# Patient Record
Sex: Female | Born: 1995
Health system: Southern US, Community
[De-identification: ages and names within clinical notes are randomized; demographics above are authoritative.]

## PROBLEM LIST (undated history)

## (undated) DIAGNOSIS — E119 Type 2 diabetes mellitus without complications: Secondary | ICD-10-CM

## (undated) HISTORY — DX: Type 2 diabetes mellitus without complications: E11.9

---

## 2013-05-10 LAB — OB RESULTS CONSOLE GC/CHLAMYDIA
Chlamydia: NEGATIVE
Gonorrhea: NEGATIVE

## 2013-05-11 LAB — OB RESULTS CONSOLE HIV ANTIBODY (ROUTINE TESTING): HIV: NONREACTIVE

## 2013-05-11 LAB — OB RESULTS CONSOLE HEPATITIS B SURFACE ANTIGEN: Hepatitis B Surface Ag: NEGATIVE

## 2013-05-11 LAB — OB RESULTS CONSOLE VARICELLA ZOSTER ANTIBODY, IGG: Varicella: IMMUNE

## 2013-05-11 LAB — OB RESULTS CONSOLE ABO/RH: RH Type: POSITIVE

## 2013-05-11 LAB — OB RESULTS CONSOLE RUBELLA ANTIBODY, IGM: Rubella: IMMUNE

## 2013-05-11 LAB — OB RESULTS CONSOLE ANTIBODY SCREEN: Antibody Screen: NEGATIVE

## 2013-05-31 ENCOUNTER — Encounter: Payer: Medicaid Other | Attending: Obstetrics and Gynecology | Admitting: *Deleted

## 2013-05-31 ENCOUNTER — Ambulatory Visit (INDEPENDENT_AMBULATORY_CARE_PROVIDER_SITE_OTHER): Payer: Medicaid Other | Admitting: Obstetrics & Gynecology

## 2013-05-31 ENCOUNTER — Encounter: Payer: Self-pay | Admitting: Obstetrics & Gynecology

## 2013-05-31 VITALS — BP 132/86 | Temp 98.0°F | Ht 67.0 in | Wt 220.1 lb

## 2013-05-31 DIAGNOSIS — Z713 Dietary counseling and surveillance: Secondary | ICD-10-CM | POA: Insufficient documentation

## 2013-05-31 DIAGNOSIS — O99810 Abnormal glucose complicating pregnancy: Secondary | ICD-10-CM | POA: Insufficient documentation

## 2013-05-31 DIAGNOSIS — O2300 Infections of kidney in pregnancy, unspecified trimester: Secondary | ICD-10-CM | POA: Insufficient documentation

## 2013-05-31 DIAGNOSIS — O2390 Unspecified genitourinary tract infection in pregnancy, unspecified trimester: Secondary | ICD-10-CM

## 2013-05-31 DIAGNOSIS — O24919 Unspecified diabetes mellitus in pregnancy, unspecified trimester: Secondary | ICD-10-CM

## 2013-05-31 DIAGNOSIS — O2342 Unspecified infection of urinary tract in pregnancy, second trimester: Secondary | ICD-10-CM

## 2013-05-31 DIAGNOSIS — O24312 Unspecified pre-existing diabetes mellitus in pregnancy, second trimester: Secondary | ICD-10-CM

## 2013-05-31 DIAGNOSIS — O24319 Unspecified pre-existing diabetes mellitus in pregnancy, unspecified trimester: Secondary | ICD-10-CM | POA: Insufficient documentation

## 2013-05-31 LAB — POCT URINALYSIS DIP (DEVICE)
Glucose, UA: 250 mg/dL — AB
Ketones, ur: NEGATIVE mg/dL
Specific Gravity, Urine: 1.025 (ref 1.005–1.030)
pH: 6.5 (ref 5.0–8.0)

## 2013-05-31 MED ORDER — GLUCOSE BLOOD VI STRP: ORAL_STRIP | Status: DC

## 2013-05-31 MED ORDER — CEPHALEXIN 500 MG PO CAPS: 500.0000 mg | ORAL_CAPSULE | Freq: Four times a day (QID) | ORAL | Status: DC

## 2013-05-31 MED ORDER — METFORMIN HCL 1000 MG PO TABS: 1000.0000 mg | ORAL_TABLET | Freq: Two times a day (BID) | ORAL | Status: DC

## 2013-05-31 MED ORDER — ACCU-CHEK FASTCLIX LANCETS MISC: Status: DC

## 2013-05-31 NOTE — Progress Notes (Signed)
ROI signed for records to be obtained from [facility].

## 2013-05-31 NOTE — Patient Instructions (Addendum)
Breastfeeding Deciding to breastfeed is one of the best choices you can make for you and your baby. A change in hormones during pregnancy causes your breast tissue to grow and increases the number and size of your milk ducts. These hormones also allow proteins, sugars, and fats from your blood supply to make breast milk in your milk-producing glands. Hormones prevent breast milk from being released before your baby is born as well as prompt milk flow after birth. Once breastfeeding has begun, thoughts of your baby, as well as his or her sucking or crying, can stimulate the release of milk from your milk-producing glands.  BENEFITS OF BREASTFEEDING For Your Baby  Your first milk (colostrum) helps your baby's digestive system function better.   There are antibodies in your milk that help your baby fight off infections.   Your baby has a lower incidence of asthma, allergies, and sudden infant death syndrome.   The nutrients in breast milk are better for your baby than infant formulas and are designed uniquely for your baby's needs.   Breast milk improves your baby's brain development.   Your baby is less likely to develop other conditions, such as childhood obesity, asthma, or type 2 diabetes mellitus.  For You   Breastfeeding helps to create a very special bond between you and your baby.   Breastfeeding is convenient. Breast milk is always available at the correct temperature and costs nothing.   Breastfeeding helps to burn calories and helps you lose the weight gained during pregnancy.   Breastfeeding makes your uterus contract to its prepregnancy size faster and slows bleeding (lochia) after you give birth.   Breastfeeding helps to lower your risk of developing type 2 diabetes mellitus, osteoporosis, and breast or ovarian cancer later in life. SIGNS THAT YOUR BABY IS HUNGRY Early Signs of Hunger  Increased alertness or activity.  Stretching.  Movement of the head from  side to side.  Movement of the head and opening of the mouth when the corner of the mouth or cheek is stroked (rooting).  Increased sucking sounds, smacking lips, cooing, sighing, or squeaking.  Hand-to-mouth movements.  Increased sucking of fingers or hands. Late Signs of Hunger  Fussing.  Intermittent crying. Extreme Signs of Hunger Signs of extreme hunger will require calming and consoling before your baby will be able to breastfeed successfully. Do not wait for the following signs of extreme hunger to occur before you initiate breastfeeding:   Restlessness.  A loud, strong cry.   Screaming. BREASTFEEDING BASICS Breastfeeding Initiation  Find a comfortable place to sit or lie down, with your neck and back well supported.  Place a pillow or rolled up blanket under your baby to bring him or her to the level of your breast (if you are seated). Nursing pillows are specially designed to help support your arms and your baby while you breastfeed.  Make sure that your baby's abdomen is facing your abdomen.   Gently massage your breast. With your fingertips, massage from your chest wall toward your nipple in a circular motion. This encourages milk flow. You may need to continue this action during the feeding if your milk flows slowly.  Support your breast with 4 fingers underneath and your thumb above your nipple. Make sure your fingers are well away from your nipple and your baby's mouth.   Stroke your baby's lips gently with your finger or nipple.   When your baby's mouth is open wide enough, quickly bring your baby to your   breast, placing your entire nipple and as much of the colored area around your nipple (areola) as possible into your baby's mouth.   More areola should be visible above your baby's upper lip than below the lower lip.   Your baby's tongue should be between his or her lower gum and your breast.   Ensure that your baby's mouth is correctly positioned  around your nipple (latched). Your baby's lips should create a seal on your breast and be turned out (everted).  It is common for your baby to suck about 2 3 minutes in order to start the flow of breast milk. Latching Teaching your baby how to latch on to your breast properly is very important. An improper latch can cause nipple pain and decreased milk supply for you and poor weight gain in your baby. Also, if your baby is not latched onto your nipple properly, he or she may swallow some air during feeding. This can make your baby fussy. Burping your baby when you switch breasts during the feeding can help to get rid of the air. However, teaching your baby to latch on properly is still the best way to prevent fussiness from swallowing air while breastfeeding. Signs that your baby has successfully latched on to your nipple:    Silent tugging or silent sucking, without causing you pain.   Swallowing heard between every 3 4 sucks.    Muscle movement above and in front of his or her ears while sucking.  Signs that your baby has not successfully latched on to nipple:   Sucking sounds or smacking sounds from your baby while breastfeeding.  Nipple pain. If you think your baby has not latched on correctly, slip your finger into the corner of your baby's mouth to break the suction and place it between your baby's gums. Attempt breastfeeding initiation again. Signs of Successful Breastfeeding Signs from your baby:   A gradual decrease in the number of sucks or complete cessation of sucking.   Falling asleep.   Relaxation of his or her body.   Retention of a small amount of milk in his or her mouth.   Letting go of your breast by himself or herself. Signs from you:  Breasts that have increased in firmness, weight, and size 1 3 hours after feeding.   Breasts that are softer immediately after breastfeeding.  Increased milk volume, as well as a change in milk consistency and color by  the 5th day of breastfeeding.   Nipples that are not sore, cracked, or bleeding. Signs That Your Randel Books is Getting Enough Milk  Wetting at least 3 diapers in a 24-hour period. The urine should be clear and pale yellow by age 64411 days.  At least 3 stools in a 24-hour period by age 64411 days. The stool should be soft and yellow.  At least 3 stools in a 24-hour period by age 644 days. The stool should be seedy and yellow.  No loss of weight greater than 10% of birth weight during the first 22 days of age.  Average weight gain of 4 7 ounces (120 210 mL) per week after age 64 days.  Consistent daily weight gain by age 60 days, without weight loss after the age of 2 weeks. After a feeding, your baby may spit up a small amount. This is common. BREASTFEEDING FREQUENCY AND DURATION Frequent feeding will help you make more milk and can prevent sore nipples and breast engorgement. Breastfeed when you feel the need to reduce  the fullness of your breasts or when your baby shows signs of hunger. This is called "breastfeeding on demand." Avoid introducing a pacifier to your baby while you are working to establish breastfeeding (the first 4 6 weeks after your baby is born). After this time you may choose to use a pacifier. Research has shown that pacifier use during the first year of a baby's life decreases the risk of sudden infant death syndrome (SIDS). Allow your baby to feed on each breast as long as he or she wants. Breastfeed until your baby is finished feeding. When your baby unlatches or falls asleep while feeding from the first breast, offer the second breast. Because newborns are often sleepy in the first few weeks of life, you may need to awaken your baby to get him or her to feed. Breastfeeding times will vary from baby to baby. However, the following rules can serve as a guide to help you ensure that your baby is properly fed:  Newborns (babies 4 weeks of age or younger) may breastfeed every 1 3  hours.  Newborns should not go longer than 3 hours during the day or 5 hours during the night without breastfeeding.  You should breastfeed your baby a minimum of 8 times in a 24-hour period until you begin to introduce solid foods to your baby at around 6 months of age. BREAST MILK PUMPING Pumping and storing breast milk allows you to ensure that your baby is exclusively fed your breast milk, even at times when you are unable to breastfeed. This is especially important if you are going back to work while you are still breastfeeding or when you are not able to be present during feedings. Your lactation consultant can give you guidelines on how long it is safe to store breast milk.  A breast pump is a machine that allows you to pump milk from your breast into a sterile bottle. The pumped breast milk can then be stored in a refrigerator or freezer. Some breast pumps are operated by hand, while others use electricity. Ask your lactation consultant which type will work best for you. Breast pumps can be purchased, but some hospitals and breastfeeding support groups lease breast pumps on a monthly basis. A lactation consultant can teach you how to hand express breast milk, if you prefer not to use a pump.  CARING FOR YOUR BREASTS WHILE YOU BREASTFEED Nipples can become dry, cracked, and sore while breastfeeding. The following recommendations can help keep your breasts moisturized and healthy:  Avoid using soap on your nipples.   Wear a supportive bra. Although not required, special nursing bras and tank tops are designed to allow access to your breasts for breastfeeding without taking off your entire bra or top. Avoid wearing underwire style bras or extremely tight bras.  Air dry your nipples for 3 4minutes after each feeding.   Use only cotton bra pads to absorb leaked breast milk. Leaking of breast milk between feedings is normal.   Use lanolin on your nipples after breastfeeding. Lanolin helps to  maintain your skin's normal moisture barrier. If you use pure lanolin you do not need to wash it off before feeding your baby again. Pure lanolin is not toxic to your baby. You may also hand express a few drops of breast milk and gently massage that milk into your nipples and allow the milk to air dry. In the first few weeks after giving birth, some women experience extremely full breasts (engorgement). Engorgement can make   your breasts feel heavy, warm, and tender to the touch. Engorgement peaks within 3 5 days after you give birth. The following recommendations can help ease engorgement:  Completely empty your breasts while breastfeeding or pumping. You may want to start by applying warm, moist heat (in the shower or with warm water-soaked hand towels) just before feeding or pumping. This increases circulation and helps the milk flow. If your baby does not completely empty your breasts while breastfeeding, pump any extra milk after he or she is finished.  Wear a snug bra (nursing or regular) or tank top for 1 2 days to signal your body to slightly decrease milk production.  Apply ice packs to your breasts, unless this is too uncomfortable for you.  Make sure that your baby is latched on and positioned properly while breastfeeding. If engorgement persists after 48 hours of following these recommendations, contact your health care provider or a Advertising copywriter. OVERALL HEALTH CARE RECOMMENDATIONS WHILE BREASTFEEDING  Eat healthy foods. Alternate between meals and snacks, eating 3 of each per day. Because what you eat affects your breast milk, some of the foods may make your baby more irritable than usual. Avoid eating these foods if you are sure that they are negatively affecting your baby.  Drink milk, fruit juice, and water to satisfy your thirst (about 10 glasses a day).   Rest often, relax, and continue to take your prenatal vitamins to prevent fatigue, stress, and anemia.  Continue  breast self-awareness checks.  Avoid chewing and smoking tobacco.  Avoid alcohol and drug use. Some medicines that may be harmful to your baby can pass through breast milk. It is important to ask your health care provider before taking any medicine, including all over-the-counter and prescription medicine as well as vitamin and herbal supplements. It is possible to become pregnant while breastfeeding. If birth control is desired, ask your health care provider about options that will be safe for your baby. SEEK MEDICAL CARE IF:   You feel like you want to stop breastfeeding or have become frustrated with breastfeeding.  You have painful breasts or nipples.  Your nipples are cracked or bleeding.  Your breasts are red, tender, or warm.  You have a swollen area on either breast.  You have a fever or chills.  You have nausea or vomiting.  You have drainage other than breast milk from your nipples.  Your breasts do not become full before feedings by the 5th day after you give birth.  You feel sad and depressed.  Your baby is too sleepy to eat well.  Your baby is having trouble sleeping.   Your baby is wetting less than 3 diapers in a 24-hour period.  Your baby has less than 3 stools in a 24-hour period.  Your baby's skin or the white part of his or her eyes becomes yellow.   Your baby is not gaining weight by 63 days of age. SEEK IMMEDIATE MEDICAL CARE IF:   Your baby is overly tired (lethargic) and does not want to wake up and feed.  Your baby develops an unexplained fever. Document Released: 05/27/2005 Document Revised: 01/27/2013 Document Reviewed: 11/18/2012 [facility] Patient Information 2014 [place], [place].   Type 1 or Type 2 Diabetes Mellitus During Pregnancy Diabetes mellitus, often simply referred to as diabetes, is a long-term (chronic) disease. Type 1 diabetes occurs when the islet cells in the pancreas that make insulin (a hormone) are destroyed and can no  longer make insulin. Type 2 diabetes occurs  when the pancreas does not make enough insulin, the cells are less responsive to the insulin that is made (insulin resistance), or both. Insulin is needed to move sugars from food into the tissue cells. The tissue cells use the sugars for energy. The lack of insulin or the lack of normal response to insulin causes excess sugars to build up in the blood instead of going into the tissue cells. As a result, high blood sugar (hyperglycemia) develops.  If blood glucose levels are kept in the normal range both before and during pregnancy, women can have a healthy pregnancy. If your blood glucose levels are not well controlled, there may be risks to you, your unborn baby (fetus), your labor and delivery, or your newborn baby.  RISK FACTORS  You are predisposed to developing type 1 diabetes if someone in your family has diabetes and you are exposed to certain environmental triggers.  You have an increased chance of developing type 2 diabetes if you have a family history of diabetes and also have one or more of the following risk factors:  Being overweight.  Having an inactive lifestyle.  Having a history of consistently eating high-calorie foods. SYMPTOMS The symptoms of diabetes include:  Increased thirst (polydipsia).  Increased urination (polyuria).  Increased urination during the night (nocturia).  Weight loss. This weight loss may be rapid.  Frequent, recurring infections.  Tiredness (fatigue).  Weakness.  Vision changes, such as blurred vision.  Fruity smell to your breath.  Abdominal pain.  Nausea or vomiting. DIAGNOSIS  Diabetes is diagnosed when blood glucose levels are increased. Your blood glucose level may be checked by one or more of the following blood tests:  A fasting blood glucose test. You will not be allowed to eat for at least 8 hours before a blood sample is taken.  A random blood glucose test. Your blood glucose is  checked at any time of the day regardless of when you ate.  A hemoglobin A1c blood glucose test. A hemoglobin A1c test provides information about blood glucose control over the previous 3 months.  An oral glucose tolerance test (OGTT). Your blood glucose is measured after you have not eaten (fasted) for 1 3 hours and then after you drink a glucose-containing beverage. An OGTT is usually performed during weeks 24 28 of your pregnancy. TREATMENT   You will need to take diabetes medicine or insulin daily to keep blood glucose levels in the desired range.  You will need to match insulin dosing with exercise and healthy food choices. The treatment goal is to maintain the before-meal (preprandial), bedtime, and overnight blood glucose level at 60 99 mg/dL during pregnancy. The treatment goal is to further maintain the peak after-meal blood sugar (postprandial glucose) level at 100 140 mg/dL.  HOME CARE INSTRUCTIONS   Have your hemoglobin A1c level checked twice a year.  Perform daily blood glucose monitoring as directed by your caregiver. It is common to perform frequent blood glucose monitoring.  Monitor urine ketones when you are ill and as directed by your caregiver.  Take your diabetes medicine and insulin as directed by your caregiver to maintain your blood glucose level in the desired range.  Never run out of diabetes medicine or insulin. It is needed every day.  Adjust insulin based on your intake of carbohydrates. Carbohydrates can raise blood glucose levels but need to be included in your diet. Carbohydrates provide vitamins, minerals, and fiber, which are an essential part of a healthy diet. Carbohydrates are  found in fruits, vegetables, whole grains, dairy products, legumes, and foods containing added sugars.  Eat healthy foods. Alternate 3 meals with 3 snacks.  Maintain a healthy weight gain. The usual total expected weight gain varies according to your prepregnancy body mass  index (BMI).  Carry a medical alert card or wear medical alert jewelry.  Carry a 15 gram carbohydrate snack with you at all times to treat low blood sugar (hypoglycemia). Some examples of 15 gram carbohydrate snacks include:  Glucose tablets, 3 or 4.  Glucose gel, 15 gram tube.  Raisins, 2 tablespoons (24 grams).  Jelly beans, 6.  Animal crackers, 8.  Fruit juice, regular soda, or low fat milk, 4 ounces (120 mL).  Gummy treats, 9.  Recognize hypoglycemia. Hypoglycemia during pregnancy occurs with blood glucose levels of 60 mg/dL and below. The risk for hypoglycemia increases when fasting or skipping meals, during or after intense exercise, and during sleep. Hypoglycemia symptoms can include:  Tremors or shakes.  Decreased ability to concentrate.  Sweating.  Increased heart rate.  Headache.  Dry mouth.  Hunger.  Irritability.  Anxiety.  Restless sleep.  Altered speech or coordination.  Confusion.  Treat hypoglycemia promptly. If you are alert and able to safely swallow, follow the 15:15 rule:  Take 15 20 grams of rapid-acting glucose or carbohydrate. Rapid-acting options include glucose gel, glucose tablets, or 4 ounces (120 mL) of fruit juice, regular soda, or low-fat milk.  Check your blood glucose level 15 minutes after taking the glucose.  Take 15 20 grams more of glucose if the repeat blood glucose level is still 70 mg/dL or below.  Eat a meal or snack within 1 hour once blood glucose levels return to normal.  Engage in at least 30 minutes of physical activity a day or as directed by your caregiver. Ten minutes of physical activity timed 30 minutes after each meal is encouraged to control postprandial blood glucose levels.   Be alert to polyuria and polydipsia, which are early signs of hyperglycemia. An early awareness of hyperglycemia allows for prompt treatment. Treat hyperglycemia as directed by your caregiver.  Adjust your insulin dosing and food  intake as needed if you start a new exercise or sport.  Follow your sick day plan at any time you are unable to eat or drink as usual.  Avoid tobacco and alcohol use.  Follow up with your caregiver regularly.  Follow the advice of your caregiver regarding your prenatal and post-delivery (postpartum) appointments, meal planning, exercise, medicines, vitamins, blood tests, other medical tests, and physical activities.  Continue daily skin and foot care. Examine your skin and feet daily for cuts, bruises, redness, nail problems, bleeding, blisters, or sores. A foot exam by a caregiver should be done annually.  Brush your teeth and gums at least twice a day and floss at least once a day. Follow up with your dentist regularly.  Schedule an eye exam during the first trimester of your pregnancy or as directed by your caregiver.  Share your diabetes management plan with your workplace or school.  Stay up-to-date with immunizations.  Learn to manage stress.  Obtain ongoing diabetes education and support as needed. SEEK MEDICAL CARE IF:   You are unable to eat food or drink fluids for more than 6 hours.  You have nausea and vomiting for more than 6 hours.  You have a blood glucose level of 200 mg/dL and you have ketones in your urine.  There is a change in mental status.  You develop vision problems.  You have a persistent headache.  You have upper abdominal pain or discomfort.  You develop an additional serious illness.  You have diarrhea for more than 6 hours.  You have been sick or have had a fever for a couple of days and are not getting better. SEEK IMMEDIATE MEDICAL CARE IF:  You have difficulty breathing.  You no longer feel the baby moving.  You are bleeding or have discharge from your vagina.  You start having premature contractions or labor. MAKE SURE YOU:  Understand these instructions.  Will watch your condition.  Will get help right away if you are not  doing well or get worse. Document Released: 02/19/2012 Document Revised: 05/13/2012 Document Reviewed: 02/19/2012 [facility] Patient Information 2014 [place], [place].

## 2013-05-31 NOTE — Progress Notes (Signed)
Nutrition note: 1st visit consult & DM diet education. Pt has h/o obesity and Type 2 DM. Pt has gained 7.1# @ [redacted]w[redacted]d, which is wnl. Pt reports eating 3 meals & 1 snack/d (normally fruit). Pt stated she received DM education when she was first diagnosed ~4y ago and followed the diet for ~23yr but has not since then. Pt reports no N/V or heartburn. Pt is taking a PNV. Pt reports no walking or physical activity. Pt received verbal & written education on DM diet during pregnancy. Discussed importance/ benefits of BF. Encouraged physical activity. Encouraged protein with all meals & snacks. Discussed wt gain goals of 11-20# or 0.5#/wk. Pt agrees to follow GDM diet with 3 meals & 1-3 snacks/d with proper CHO/ protein combination.  Pt has WIC and is unsure about BF. F/u in 2-4 wks Blondell Reveal, MS, RD, LDN, [facility]

## 2013-05-31 NOTE — Progress Notes (Signed)
Pulse- 84 New ob packet given Weight gain of 11-20lb Flu vaccine given @ [facility]

## 2013-05-31 NOTE — Progress Notes (Signed)
DIABETES: Patient presents with her mother. T2DM diagnosed at age 17. Education one visit with nutritionist at time of diagnosis. Patient reports that she is presently testing FBS and 2hpp all meals yet no readings are available and she did not test this AM. Notes that readings range 120-130 at all times.  Advised to test FBS and 2hpp all meals. Continue with medication as directed. Goal to increase walking for 5X weekly. At present she is not working and inactive at home. Accu Chek Nano BG Monitoring Kit dispensed Lot: [id number] Ex;p: 05/09/14 Will review data next week.

## 2013-05-31 NOTE — Progress Notes (Signed)
Transfer from [facility] for Class B DM diagnosed 4 years ago.  Was under the care of a physician in [place], just moved to the area, no PCP here. On Metformin 500 mg po bid, reports fastings in 130s, postprandials in 140s;does not check frequently Discussed increased risks of maternal and fetal morbidity and mortality with DM, especially poorly controlled DM Also reviewed need for increased surveillance, antenatal testing, labs and delivery by 39 weeks or earlier if indcated Initial labs at [facility] have not been sent, will review and will check any outstanding labs next week Patient was admitted to [facility] for pyelonephritis, still reports urinary symptoms.  UA today showed + nitrites and moderate LE, culture sent.  Keflex presumptively prescribed, will follow up C&S.  Will likely need to be on suppression after this treatment. ROI form to be sent to [facility] for admission details, also need reports from two ultrasounds that were done there including one done a couple of weeks ago (anatomy scan?) Patient to meet with CDE today for DM education.  In the meantime, she was told to increase Metformin to 1000 mg po bid, will titrate regimen based on blood sugars No other complaints or concerns.  Routine obstetric precautions reviewed.

## 2013-06-01 ENCOUNTER — Encounter: Payer: Self-pay | Admitting: *Deleted

## 2013-06-01 LAB — CULTURE, OB URINE: Colony Count: >=100000

## 2013-06-02 ENCOUNTER — Telehealth: Payer: Self-pay

## 2013-06-02 NOTE — Telephone Encounter (Signed)
Message copied by Louanna Raw on Wed Jun 02, 2013  8:03 AM ------      Message from: Jaynie Collins A      Created: Wed Jun 02, 2013 12:41 AM       Already treated with Keflex.  Please call and tell patient results. When she finishes taking her treatment course as prescribed, she needs to take Keflex 500 mg po qhs for the remainder of pregnancy. ------

## 2013-06-02 NOTE — Telephone Encounter (Signed)
Called pt. And informed her of the need to continue taking Keflex throughout her pregnancy. Explained prescription is at her pharmacy and she will take it 4 times a a day every day and that there are multiple refills at the pharmacy. Pt. Verbalized understanding and gratitude and had no other questions or concerns.

## 2013-06-07 ENCOUNTER — Ambulatory Visit (INDEPENDENT_AMBULATORY_CARE_PROVIDER_SITE_OTHER): Payer: Medicaid Other | Admitting: Obstetrics and Gynecology

## 2013-06-07 ENCOUNTER — Encounter: Payer: Self-pay | Admitting: Obstetrics and Gynecology

## 2013-06-07 VITALS — BP 122/75 | Temp 97.3°F | Wt 222.8 lb

## 2013-06-07 DIAGNOSIS — O2390 Unspecified genitourinary tract infection in pregnancy, unspecified trimester: Secondary | ICD-10-CM

## 2013-06-07 DIAGNOSIS — O24919 Unspecified diabetes mellitus in pregnancy, unspecified trimester: Secondary | ICD-10-CM

## 2013-06-07 DIAGNOSIS — O2300 Infections of kidney in pregnancy, unspecified trimester: Secondary | ICD-10-CM

## 2013-06-07 DIAGNOSIS — O24312 Unspecified pre-existing diabetes mellitus in pregnancy, second trimester: Secondary | ICD-10-CM

## 2013-06-07 DIAGNOSIS — N12 Tubulo-interstitial nephritis, not specified as acute or chronic: Secondary | ICD-10-CM

## 2013-06-07 LAB — POCT URINALYSIS DIP (DEVICE)
Ketones, ur: NEGATIVE mg/dL
Nitrite: NEGATIVE
Protein, ur: NEGATIVE mg/dL
Specific Gravity, Urine: 1.02 (ref 1.005–1.030)
Urobilinogen, UA: 0.2 mg/dL (ref 0.0–1.0)
pH: 7 (ref 5.0–8.0)

## 2013-06-07 MED ORDER — GLYBURIDE 2.5 MG PO TABS: ORAL_TABLET | ORAL | Status: DC

## 2013-06-07 NOTE — Progress Notes (Signed)
U/S scheduled 06/15/13 at 930 am.

## 2013-06-07 NOTE — Progress Notes (Signed)
Patient doing well without complaints. FM/PTL precautions reviewed. CBGs reviewed and all fasting in the 110's range. 2hr pp majority within range highest value of 140 on Christmas day Will start glyburide 2.5 mg qHS Will order baseline labs

## 2013-06-07 NOTE — Progress Notes (Signed)
P-85 

## 2013-06-14 ENCOUNTER — Encounter: Payer: Self-pay | Admitting: *Deleted

## 2013-06-15 ENCOUNTER — Ambulatory Visit (HOSPITAL_COMMUNITY)
Admission: RE | Admit: 2013-06-15 | Discharge: 2013-06-15 | Disposition: A | Discharge status: Home / Self Care | Payer: Medicaid Other | Source: Ambulatory Visit | Attending: Obstetrics and Gynecology | Admitting: Obstetrics and Gynecology

## 2013-06-15 ENCOUNTER — Encounter: Payer: Self-pay | Admitting: Obstetrics and Gynecology

## 2013-06-15 DIAGNOSIS — O24919 Unspecified diabetes mellitus in pregnancy, unspecified trimester: Secondary | ICD-10-CM | POA: Insufficient documentation

## 2013-06-15 DIAGNOSIS — O24312 Unspecified pre-existing diabetes mellitus in pregnancy, second trimester: Secondary | ICD-10-CM

## 2013-06-21 ENCOUNTER — Encounter: Payer: Self-pay | Admitting: Obstetrics and Gynecology

## 2013-06-21 ENCOUNTER — Ambulatory Visit (INDEPENDENT_AMBULATORY_CARE_PROVIDER_SITE_OTHER): Payer: Medicaid Other | Admitting: Obstetrics and Gynecology

## 2013-06-21 VITALS — BP 114/76 | Temp 98.5°F | Wt 223.8 lb

## 2013-06-21 DIAGNOSIS — N12 Tubulo-interstitial nephritis, not specified as acute or chronic: Secondary | ICD-10-CM

## 2013-06-21 DIAGNOSIS — O2390 Unspecified genitourinary tract infection in pregnancy, unspecified trimester: Secondary | ICD-10-CM

## 2013-06-21 DIAGNOSIS — O2300 Infections of kidney in pregnancy, unspecified trimester: Secondary | ICD-10-CM

## 2013-06-21 DIAGNOSIS — O24312 Unspecified pre-existing diabetes mellitus in pregnancy, second trimester: Secondary | ICD-10-CM

## 2013-06-21 DIAGNOSIS — O24919 Unspecified diabetes mellitus in pregnancy, unspecified trimester: Secondary | ICD-10-CM

## 2013-06-21 LAB — POCT URINALYSIS DIP (DEVICE)
Bilirubin Urine: NEGATIVE
GLUCOSE, UA: NEGATIVE mg/dL
Hgb urine dipstick: NEGATIVE
Ketones, ur: 15 mg/dL — AB
Nitrite: NEGATIVE
PROTEIN: NEGATIVE mg/dL
Specific Gravity, Urine: 1.015 (ref 1.005–1.030)
UROBILINOGEN UA: 0.2 mg/dL (ref 0.0–1.0)
pH: 6.5 (ref 5.0–8.0)

## 2013-06-21 NOTE — Progress Notes (Signed)
Pulse: 82

## 2013-06-21 NOTE — Progress Notes (Signed)
Patient doing well without complaints. CBG f- all but one within range 105  2 hr pp all but 2 within range 125 and 127 (both after dinner). Will schedule f/u antomy ultrasound and fetal echo. Patient is having some transportation issues as she lives in [place].

## 2013-06-21 NOTE — Progress Notes (Signed)
U/S scheduled 07/22/13 at 1015 am. Fetal Echo scheduled 07/13/13 at 1 pm with Dr. Elizebeth Brookingotton. Case # [id number]. Auth # [id number]. Patient has contact information for both appointments.

## 2013-07-05 ENCOUNTER — Ambulatory Visit (INDEPENDENT_AMBULATORY_CARE_PROVIDER_SITE_OTHER): Payer: Medicaid Other | Admitting: Family Medicine

## 2013-07-05 VITALS — BP 125/79 | Wt 226.5 lb

## 2013-07-05 DIAGNOSIS — E119 Type 2 diabetes mellitus without complications: Secondary | ICD-10-CM

## 2013-07-05 DIAGNOSIS — N12 Tubulo-interstitial nephritis, not specified as acute or chronic: Secondary | ICD-10-CM

## 2013-07-05 DIAGNOSIS — O24319 Unspecified pre-existing diabetes mellitus in pregnancy, unspecified trimester: Secondary | ICD-10-CM

## 2013-07-05 DIAGNOSIS — O2300 Infections of kidney in pregnancy, unspecified trimester: Secondary | ICD-10-CM

## 2013-07-05 DIAGNOSIS — Z23 Encounter for immunization: Secondary | ICD-10-CM

## 2013-07-05 DIAGNOSIS — O2390 Unspecified genitourinary tract infection in pregnancy, unspecified trimester: Secondary | ICD-10-CM

## 2013-07-05 DIAGNOSIS — O24919 Unspecified diabetes mellitus in pregnancy, unspecified trimester: Secondary | ICD-10-CM

## 2013-07-05 LAB — CBC
HCT: 32.2 % — ABNORMAL LOW (ref 36.0–46.0)
Hemoglobin: 11.2 g/dL — ABNORMAL LOW (ref 12.0–15.0)
MCH: 31.1 pg (ref 26.0–34.0)
MCHC: 34.8 g/dL (ref 30.0–36.0)
MCV: 89.4 fL (ref 78.0–100.0)
PLATELETS: 230 10*3/uL (ref 150–400)
RBC: 3.6 MIL/uL — AB (ref 3.87–5.11)
RDW: 13.5 % (ref 11.5–15.5)
WBC: 4.6 10*3/uL (ref 4.0–10.5)

## 2013-07-05 LAB — POCT URINALYSIS DIP (DEVICE)
BILIRUBIN URINE: NEGATIVE
GLUCOSE, UA: 250 mg/dL — AB
Hgb urine dipstick: NEGATIVE
Ketones, ur: NEGATIVE mg/dL
Nitrite: NEGATIVE
Protein, ur: NEGATIVE mg/dL
Specific Gravity, Urine: 1.025 (ref 1.005–1.030)
UROBILINOGEN UA: 1 mg/dL (ref 0.0–1.0)
pH: 6.5 (ref 5.0–8.0)

## 2013-07-05 LAB — RPR

## 2013-07-05 LAB — HIV ANTIBODY (ROUTINE TESTING W REFLEX): HIV: NONREACTIVE

## 2013-07-05 MED ORDER — TETANUS-DIPHTH-ACELL PERTUSSIS 5-2.5-18.5 LF-MCG/0.5 IM SUSP: 0.5000 mL | Freq: Once | INTRAMUSCULAR | Status: DC

## 2013-07-05 NOTE — Patient Instructions (Signed)
Third Trimester of Pregnancy  The third trimester is from week 29 through week 42, months 7 through 9. The third trimester is a time when the fetus is growing rapidly. At the end of the ninth month, the fetus is about 20 inches in length and weighs 6 10 pounds.   BODY CHANGES  Your body goes through many changes during pregnancy. The changes vary from woman to woman.    Your weight will continue to increase. You can expect to gain 25 35 pounds (11 16 kg) by the end of the pregnancy.   You may begin to get stretch marks on your hips, abdomen, and breasts.   You may urinate more often because the fetus is moving lower into your pelvis and pressing on your bladder.   You may develop or continue to have heartburn as a result of your pregnancy.   You may develop constipation because certain hormones are causing the muscles that push waste through your intestines to slow down.   You may develop hemorrhoids or swollen, bulging veins (varicose veins).   You may have pelvic pain because of the weight gain and pregnancy hormones relaxing your joints between the bones in your pelvis. Back aches may result from over exertion of the muscles supporting your posture.   Your breasts will continue to grow and be tender. A yellow discharge may leak from your breasts called colostrum.   Your belly button may stick out.   You may feel short of breath because of your expanding uterus.   You may notice the fetus "dropping," or moving lower in your abdomen.   You may have a bloody mucus discharge. This usually occurs a few days to a week before labor begins.   Your cervix becomes thin and soft (effaced) near your due date.  WHAT TO EXPECT AT YOUR PRENATAL EXAMS   You will have prenatal exams every 2 weeks until week 36. Then, you will have weekly prenatal exams. During a routine prenatal visit:   You will be weighed to make sure you and the fetus are growing normally.   Your blood pressure is taken.   Your abdomen will be  measured to track your baby's growth.   The fetal heartbeat will be listened to.   Any test results from the previous visit will be discussed.   You may have a cervical check near your due date to see if you have effaced.  At around 36 weeks, your caregiver will check your cervix. At the same time, your caregiver will also perform a test on the secretions of the vaginal tissue. This test is to determine if a type of bacteria, Group B streptococcus, is present. Your caregiver will explain this further.  Your caregiver may ask you:   What your birth plan is.   How you are feeling.   If you are feeling the baby move.   If you have had any abnormal symptoms, such as leaking fluid, bleeding, severe headaches, or abdominal cramping.   If you have any questions.  Other tests or screenings that may be performed during your third trimester include:   Blood tests that check for low iron levels (anemia).   Fetal testing to check the health, activity level, and growth of the fetus. Testing is done if you have certain medical conditions or if there are problems during the pregnancy.  FALSE LABOR  You may feel small, irregular contractions that eventually go away. These are called Braxton Hicks contractions, or   false labor. Contractions may last for hours, days, or even weeks before true labor sets in. If contractions come at regular intervals, intensify, or become painful, it is best to be seen by your caregiver.   SIGNS OF LABOR    Menstrual-like cramps.   Contractions that are 5 minutes apart or less.   Contractions that start on the top of the uterus and spread down to the lower abdomen and back.   A sense of increased pelvic pressure or back pain.   A watery or bloody mucus discharge that comes from the vagina.  If you have any of these signs before the 37th week of pregnancy, call your caregiver right away. You need to go to the hospital to get checked immediately.  HOME CARE INSTRUCTIONS    Avoid all  smoking, herbs, alcohol, and unprescribed drugs. These chemicals affect the formation and growth of the baby.   Follow your caregiver's instructions regarding medicine use. There are medicines that are either safe or unsafe to take during pregnancy.   Exercise only as directed by your caregiver. Experiencing uterine cramps is a good sign to stop exercising.   Continue to eat regular, healthy meals.   Wear a good support bra for breast tenderness.   Do not use hot tubs, steam rooms, or saunas.   Wear your seat belt at all times when driving.   Avoid raw meat, uncooked cheese, cat litter boxes, and soil used by cats. These carry germs that can cause birth defects in the baby.   Take your prenatal vitamins.   Try taking a stool softener (if your caregiver approves) if you develop constipation. Eat more high-fiber foods, such as fresh vegetables or fruit and whole grains. Drink plenty of fluids to keep your urine clear or pale yellow.   Take warm sitz baths to soothe any pain or discomfort caused by hemorrhoids. Use hemorrhoid cream if your caregiver approves.   If you develop varicose veins, wear support hose. Elevate your feet for 15 minutes, 3 4 times a day. Limit salt in your diet.   Avoid heavy lifting, wear low heal shoes, and practice good posture.   Rest a lot with your legs elevated if you have leg cramps or low back pain.   Visit your dentist if you have not gone during your pregnancy. Use a soft toothbrush to brush your teeth and be gentle when you floss.   A sexual relationship may be continued unless your caregiver directs you otherwise.   Do not travel far distances unless it is absolutely necessary and only with the approval of your caregiver.   Take prenatal classes to understand, practice, and ask questions about the labor and delivery.   Make a trial run to the hospital.   Pack your hospital bag.   Prepare the baby's nursery.   Continue to go to all your prenatal visits as directed  by your caregiver.  SEEK MEDICAL CARE IF:   You are unsure if you are in labor or if your water has broken.   You have dizziness.   You have mild pelvic cramps, pelvic pressure, or nagging pain in your abdominal area.   You have persistent nausea, vomiting, or diarrhea.   You have a bad smelling vaginal discharge.   You have pain with urination.  SEEK IMMEDIATE MEDICAL CARE IF:    You have a fever.   You are leaking fluid from your vagina.   You have spotting or bleeding from your vagina.     You have severe abdominal cramping or pain.   You have rapid weight loss or gain.   You have shortness of breath with chest pain.   You notice sudden or extreme swelling of your face, hands, ankles, feet, or legs.   You have not felt your baby move in over an hour.   You have severe headaches that do not go away with medicine.   You have vision changes.  Document Released: 05/21/2001 Document Revised: 01/27/2013 Document Reviewed: 07/28/2012  [employer] Patient Information 2014 [employer]

## 2013-07-05 NOTE — Progress Notes (Signed)
Pulse: 85

## 2013-07-05 NOTE — Progress Notes (Signed)
18 yo G1 @ 7333w2d here for ROBV and a hx of class B DM.  - doing well  - has growth US and fetal echo scheduled.  CBG fasting: 80-90. Added glyburide to regimen last time.   CBG pp: 77-139 (3 out of range)  Has been doing better since   - no complaints.  - +FM, no lof,vb, Ctx.   O: see flowsheet.   A/P: DM- improved now that on glyburide and metformin. No changes today  - 28 week labs done today - f/u us results as done - f/u in 2 weeks.

## 2013-07-14 ENCOUNTER — Encounter: Payer: Self-pay | Admitting: *Deleted

## 2013-07-19 ENCOUNTER — Ambulatory Visit (INDEPENDENT_AMBULATORY_CARE_PROVIDER_SITE_OTHER): Payer: Medicaid Other | Admitting: Obstetrics & Gynecology

## 2013-07-19 ENCOUNTER — Encounter: Payer: Self-pay | Admitting: Obstetrics & Gynecology

## 2013-07-19 VITALS — BP 132/90 | Temp 98.0°F | Wt 224.1 lb

## 2013-07-19 DIAGNOSIS — N12 Tubulo-interstitial nephritis, not specified as acute or chronic: Secondary | ICD-10-CM

## 2013-07-19 DIAGNOSIS — O24919 Unspecified diabetes mellitus in pregnancy, unspecified trimester: Secondary | ICD-10-CM

## 2013-07-19 DIAGNOSIS — O2300 Infections of kidney in pregnancy, unspecified trimester: Secondary | ICD-10-CM

## 2013-07-19 DIAGNOSIS — O2390 Unspecified genitourinary tract infection in pregnancy, unspecified trimester: Secondary | ICD-10-CM

## 2013-07-19 DIAGNOSIS — E119 Type 2 diabetes mellitus without complications: Secondary | ICD-10-CM

## 2013-07-19 DIAGNOSIS — O24319 Unspecified pre-existing diabetes mellitus in pregnancy, unspecified trimester: Secondary | ICD-10-CM

## 2013-07-19 LAB — POCT URINALYSIS DIP (DEVICE)
Bilirubin Urine: NEGATIVE
Hgb urine dipstick: NEGATIVE
Ketones, ur: 15 mg/dL — AB
LEUKOCYTES UA: NEGATIVE
NITRITE: NEGATIVE
Protein, ur: NEGATIVE mg/dL
Specific Gravity, Urine: 1.025 (ref 1.005–1.030)
Urobilinogen, UA: 0.2 mg/dL (ref 0.0–1.0)
pH: 6.5 (ref 5.0–8.0)

## 2013-07-19 MED ORDER — PRENATAL PLUS 27-1 MG PO TABS: 1.0000 | ORAL_TABLET | Freq: Every day | ORAL | Status: DC

## 2013-07-19 NOTE — Progress Notes (Signed)
All values still in range will continue current dose

## 2013-07-19 NOTE — Patient Instructions (Signed)
Third Trimester of Pregnancy  The third trimester is from week 29 through week 42, months 7 through 9. The third trimester is a time when the fetus is growing rapidly. At the end of the ninth month, the fetus is about 20 inches in length and weighs 6 10 pounds.   BODY CHANGES  Your body goes through many changes during pregnancy. The changes vary from woman to woman.    Your weight will continue to increase. You can expect to gain 25 35 pounds (11 16 kg) by the end of the pregnancy.   You may begin to get stretch marks on your hips, abdomen, and breasts.   You may urinate more often because the fetus is moving lower into your pelvis and pressing on your bladder.   You may develop or continue to have heartburn as a result of your pregnancy.   You may develop constipation because certain hormones are causing the muscles that push waste through your intestines to slow down.   You may develop hemorrhoids or swollen, bulging veins (varicose veins).   You may have pelvic pain because of the weight gain and pregnancy hormones relaxing your joints between the bones in your pelvis. Back aches may result from over exertion of the muscles supporting your posture.   Your breasts will continue to grow and be tender. A yellow discharge may leak from your breasts called colostrum.   Your belly button may stick out.   You may feel short of breath because of your expanding uterus.   You may notice the fetus "dropping," or moving lower in your abdomen.   You may have a bloody mucus discharge. This usually occurs a few days to a week before labor begins.   Your cervix becomes thin and soft (effaced) near your due date.  WHAT TO EXPECT AT YOUR PRENATAL EXAMS   You will have prenatal exams every 2 weeks until week 36. Then, you will have weekly prenatal exams. During a routine prenatal visit:   You will be weighed to make sure you and the fetus are growing normally.   Your blood pressure is taken.   Your abdomen will be  measured to track your baby's growth.   The fetal heartbeat will be listened to.   Any test results from the previous visit will be discussed.   You may have a cervical check near your due date to see if you have effaced.  At around 36 weeks, your caregiver will check your cervix. At the same time, your caregiver will also perform a test on the secretions of the vaginal tissue. This test is to determine if a type of bacteria, Group B streptococcus, is present. Your caregiver will explain this further.  Your caregiver may ask you:   What your birth plan is.   How you are feeling.   If you are feeling the baby move.   If you have had any abnormal symptoms, such as leaking fluid, bleeding, severe headaches, or abdominal cramping.   If you have any questions.  Other tests or screenings that may be performed during your third trimester include:   Blood tests that check for low iron levels (anemia).   Fetal testing to check the health, activity level, and growth of the fetus. Testing is done if you have certain medical conditions or if there are problems during the pregnancy.  FALSE LABOR  You may feel small, irregular contractions that eventually go away. These are called Braxton Hicks contractions, or   false labor. Contractions may last for hours, days, or even weeks before true labor sets in. If contractions come at regular intervals, intensify, or become painful, it is best to be seen by your caregiver.   SIGNS OF LABOR    Menstrual-like cramps.   Contractions that are 5 minutes apart or less.   Contractions that start on the top of the uterus and spread down to the lower abdomen and back.   A sense of increased pelvic pressure or back pain.   A watery or bloody mucus discharge that comes from the vagina.  If you have any of these signs before the 37th week of pregnancy, call your caregiver right away. You need to go to the hospital to get checked immediately.  HOME CARE INSTRUCTIONS    Avoid all  smoking, herbs, alcohol, and unprescribed drugs. These chemicals affect the formation and growth of the baby.   Follow your caregiver's instructions regarding medicine use. There are medicines that are either safe or unsafe to take during pregnancy.   Exercise only as directed by your caregiver. Experiencing uterine cramps is a good sign to stop exercising.   Continue to eat regular, healthy meals.   Wear a good support bra for breast tenderness.   Do not use hot tubs, steam rooms, or saunas.   Wear your seat belt at all times when driving.   Avoid raw meat, uncooked cheese, cat litter boxes, and soil used by cats. These carry germs that can cause birth defects in the baby.   Take your prenatal vitamins.   Try taking a stool softener (if your caregiver approves) if you develop constipation. Eat more high-fiber foods, such as fresh vegetables or fruit and whole grains. Drink plenty of fluids to keep your urine clear or pale yellow.   Take warm sitz baths to soothe any pain or discomfort caused by hemorrhoids. Use hemorrhoid cream if your caregiver approves.   If you develop varicose veins, wear support hose. Elevate your feet for 15 minutes, 3 4 times a day. Limit salt in your diet.   Avoid heavy lifting, wear low heal shoes, and practice good posture.   Rest a lot with your legs elevated if you have leg cramps or low back pain.   Visit your dentist if you have not gone during your pregnancy. Use a soft toothbrush to brush your teeth and be gentle when you floss.   A sexual relationship may be continued unless your caregiver directs you otherwise.   Do not travel far distances unless it is absolutely necessary and only with the approval of your caregiver.   Take prenatal classes to understand, practice, and ask questions about the labor and delivery.   Make a trial run to the hospital.   Pack your hospital bag.   Prepare the baby's nursery.   Continue to go to all your prenatal visits as directed  by your caregiver.  SEEK MEDICAL CARE IF:   You are unsure if you are in labor or if your water has broken.   You have dizziness.   You have mild pelvic cramps, pelvic pressure, or nagging pain in your abdominal area.   You have persistent nausea, vomiting, or diarrhea.   You have a bad smelling vaginal discharge.   You have pain with urination.  SEEK IMMEDIATE MEDICAL CARE IF:    You have a fever.   You are leaking fluid from your vagina.   You have spotting or bleeding from your vagina.     You have severe abdominal cramping or pain.   You have rapid weight loss or gain.   You have shortness of breath with chest pain.   You notice sudden or extreme swelling of your face, hands, ankles, feet, or legs.   You have not felt your baby move in over an hour.   You have severe headaches that do not go away with medicine.   You have vision changes.  Document Released: 05/21/2001 Document Revised: 01/27/2013 Document Reviewed: 07/28/2012  [employer] Patient Information 2014 [employer]

## 2013-07-19 NOTE — Progress Notes (Signed)
P-89 

## 2013-07-22 ENCOUNTER — Ambulatory Visit (HOSPITAL_COMMUNITY)
Admission: RE | Admit: 2013-07-22 | Discharge: 2013-07-22 | Disposition: A | Discharge status: Home / Self Care | Payer: Medicaid Other | Source: Ambulatory Visit | Attending: Obstetrics and Gynecology | Admitting: Obstetrics and Gynecology

## 2013-07-22 ENCOUNTER — Encounter: Payer: Self-pay | Admitting: Obstetrics and Gynecology

## 2013-07-22 DIAGNOSIS — O24312 Unspecified pre-existing diabetes mellitus in pregnancy, second trimester: Secondary | ICD-10-CM

## 2013-07-22 DIAGNOSIS — Z3689 Encounter for other specified antenatal screening: Secondary | ICD-10-CM | POA: Insufficient documentation

## 2013-08-02 ENCOUNTER — Ambulatory Visit (INDEPENDENT_AMBULATORY_CARE_PROVIDER_SITE_OTHER): Payer: Medicaid Other | Admitting: Family Medicine

## 2013-08-02 VITALS — BP 131/91 | Temp 97.2°F | Wt 220.0 lb

## 2013-08-02 DIAGNOSIS — O2300 Infections of kidney in pregnancy, unspecified trimester: Secondary | ICD-10-CM

## 2013-08-02 DIAGNOSIS — O2390 Unspecified genitourinary tract infection in pregnancy, unspecified trimester: Secondary | ICD-10-CM

## 2013-08-02 DIAGNOSIS — O24919 Unspecified diabetes mellitus in pregnancy, unspecified trimester: Secondary | ICD-10-CM

## 2013-08-02 DIAGNOSIS — E119 Type 2 diabetes mellitus without complications: Secondary | ICD-10-CM

## 2013-08-02 DIAGNOSIS — O24319 Unspecified pre-existing diabetes mellitus in pregnancy, unspecified trimester: Secondary | ICD-10-CM

## 2013-08-02 DIAGNOSIS — N12 Tubulo-interstitial nephritis, not specified as acute or chronic: Secondary | ICD-10-CM

## 2013-08-02 LAB — POCT URINALYSIS DIP (DEVICE)
BILIRUBIN URINE: NEGATIVE
Glucose, UA: 500 mg/dL — AB
Ketones, ur: 80 mg/dL — AB
Nitrite: NEGATIVE
PROTEIN: 30 mg/dL — AB
Specific Gravity, Urine: 1.025 (ref 1.005–1.030)
Urobilinogen, UA: 0.2 mg/dL (ref 0.0–1.0)
pH: 7 (ref 5.0–8.0)

## 2013-08-02 MED ORDER — CEPHALEXIN 500 MG PO CAPS: 500.0000 mg | ORAL_CAPSULE | Freq: Two times a day (BID) | ORAL | Status: DC

## 2013-08-02 NOTE — Progress Notes (Signed)
18 yo G1 @ 798w2d here for ROBV and a hx of class B DM.   - doing well  -  CBG fasting: 80-90.   CBG pp: 100-111   Doing well - needs refill on keflex - +FM, no lof,vb, Ctx.   O: see flowsheet.   A/P:  DM- doing well.  - start twice weekly testing next week - US for growth in 2 weeks   Hx of pyelonephritis - refill of keflex suppression    F/u in 2 weeks

## 2013-08-02 NOTE — Patient Instructions (Signed)
Third Trimester of Pregnancy  The third trimester is from week 29 through week 42, months 7 through 9. The third trimester is a time when the fetus is growing rapidly. At the end of the ninth month, the fetus is about 20 inches in length and weighs 6 10 pounds.   BODY CHANGES  Your body goes through many changes during pregnancy. The changes vary from woman to woman.    Your weight will continue to increase. You can expect to gain 25 35 pounds (11 16 kg) by the end of the pregnancy.   You may begin to get stretch marks on your hips, abdomen, and breasts.   You may urinate more often because the fetus is moving lower into your pelvis and pressing on your bladder.   You may develop or continue to have heartburn as a result of your pregnancy.   You may develop constipation because certain hormones are causing the muscles that push waste through your intestines to slow down.   You may develop hemorrhoids or swollen, bulging veins (varicose veins).   You may have pelvic pain because of the weight gain and pregnancy hormones relaxing your joints between the bones in your pelvis. Back aches may result from over exertion of the muscles supporting your posture.   Your breasts will continue to grow and be tender. A yellow discharge may leak from your breasts called colostrum.   Your belly button may stick out.   You may feel short of breath because of your expanding uterus.   You may notice the fetus "dropping," or moving lower in your abdomen.   You may have a bloody mucus discharge. This usually occurs a few days to a week before labor begins.   Your cervix becomes thin and soft (effaced) near your due date.  WHAT TO EXPECT AT YOUR PRENATAL EXAMS   You will have prenatal exams every 2 weeks until week 36. Then, you will have weekly prenatal exams. During a routine prenatal visit:   You will be weighed to make sure you and the fetus are growing normally.   Your blood pressure is taken.   Your abdomen will be  measured to track your baby's growth.   The fetal heartbeat will be listened to.   Any test results from the previous visit will be discussed.   You may have a cervical check near your due date to see if you have effaced.  At around 36 weeks, your caregiver will check your cervix. At the same time, your caregiver will also perform a test on the secretions of the vaginal tissue. This test is to determine if a type of bacteria, Group B streptococcus, is present. Your caregiver will explain this further.  Your caregiver may ask you:   What your birth plan is.   How you are feeling.   If you are feeling the baby move.   If you have had any abnormal symptoms, such as leaking fluid, bleeding, severe headaches, or abdominal cramping.   If you have any questions.  Other tests or screenings that may be performed during your third trimester include:   Blood tests that check for low iron levels (anemia).   Fetal testing to check the health, activity level, and growth of the fetus. Testing is done if you have certain medical conditions or if there are problems during the pregnancy.  FALSE LABOR  You may feel small, irregular contractions that eventually go away. These are called Braxton Hicks contractions, or   false labor. Contractions may last for hours, days, or even weeks before true labor sets in. If contractions come at regular intervals, intensify, or become painful, it is best to be seen by your caregiver.   SIGNS OF LABOR    Menstrual-like cramps.   Contractions that are 5 minutes apart or less.   Contractions that start on the top of the uterus and spread down to the lower abdomen and back.   A sense of increased pelvic pressure or back pain.   A watery or bloody mucus discharge that comes from the vagina.  If you have any of these signs before the 37th week of pregnancy, call your caregiver right away. You need to go to the hospital to get checked immediately.  HOME CARE INSTRUCTIONS    Avoid all  smoking, herbs, alcohol, and unprescribed drugs. These chemicals affect the formation and growth of the baby.   Follow your caregiver's instructions regarding medicine use. There are medicines that are either safe or unsafe to take during pregnancy.   Exercise only as directed by your caregiver. Experiencing uterine cramps is a good sign to stop exercising.   Continue to eat regular, healthy meals.   Wear a good support bra for breast tenderness.   Do not use hot tubs, steam rooms, or saunas.   Wear your seat belt at all times when driving.   Avoid raw meat, uncooked cheese, cat litter boxes, and soil used by cats. These carry germs that can cause birth defects in the baby.   Take your prenatal vitamins.   Try taking a stool softener (if your caregiver approves) if you develop constipation. Eat more high-fiber foods, such as fresh vegetables or fruit and whole grains. Drink plenty of fluids to keep your urine clear or pale yellow.   Take warm sitz baths to soothe any pain or discomfort caused by hemorrhoids. Use hemorrhoid cream if your caregiver approves.   If you develop varicose veins, wear support hose. Elevate your feet for 15 minutes, 3 4 times a day. Limit salt in your diet.   Avoid heavy lifting, wear low heal shoes, and practice good posture.   Rest a lot with your legs elevated if you have leg cramps or low back pain.   Visit your dentist if you have not gone during your pregnancy. Use a soft toothbrush to brush your teeth and be gentle when you floss.   A sexual relationship may be continued unless your caregiver directs you otherwise.   Do not travel far distances unless it is absolutely necessary and only with the approval of your caregiver.   Take prenatal classes to understand, practice, and ask questions about the labor and delivery.   Make a trial run to the hospital.   Pack your hospital bag.   Prepare the baby's nursery.   Continue to go to all your prenatal visits as directed  by your caregiver.  SEEK MEDICAL CARE IF:   You are unsure if you are in labor or if your water has broken.   You have dizziness.   You have mild pelvic cramps, pelvic pressure, or nagging pain in your abdominal area.   You have persistent nausea, vomiting, or diarrhea.   You have a bad smelling vaginal discharge.   You have pain with urination.  SEEK IMMEDIATE MEDICAL CARE IF:    You have a fever.   You are leaking fluid from your vagina.   You have spotting or bleeding from your vagina.     You have severe abdominal cramping or pain.   You have rapid weight loss or gain.   You have shortness of breath with chest pain.   You notice sudden or extreme swelling of your face, hands, ankles, feet, or legs.   You have not felt your baby move in over an hour.   You have severe headaches that do not go away with medicine.   You have vision changes.  Document Released: 05/21/2001 Document Revised: 01/27/2013 Document Reviewed: 07/28/2012  [employer] Patient Information 2014 [employer]

## 2013-08-02 NOTE — Progress Notes (Signed)
Pulse- 106 Pt needs another refill on Keflex 0 refills left at pharmacy

## 2013-08-09 ENCOUNTER — Encounter: Payer: Self-pay | Admitting: Obstetrics and Gynecology

## 2013-08-09 ENCOUNTER — Ambulatory Visit (INDEPENDENT_AMBULATORY_CARE_PROVIDER_SITE_OTHER): Payer: Medicaid Other | Admitting: Obstetrics and Gynecology

## 2013-08-09 VITALS — BP 120/77 | Temp 97.5°F | Wt 228.7 lb

## 2013-08-09 DIAGNOSIS — O24319 Unspecified pre-existing diabetes mellitus in pregnancy, unspecified trimester: Secondary | ICD-10-CM

## 2013-08-09 DIAGNOSIS — O24919 Unspecified diabetes mellitus in pregnancy, unspecified trimester: Secondary | ICD-10-CM

## 2013-08-09 DIAGNOSIS — E119 Type 2 diabetes mellitus without complications: Secondary | ICD-10-CM

## 2013-08-09 LAB — POCT URINALYSIS DIP (DEVICE)
BILIRUBIN URINE: NEGATIVE
Glucose, UA: 100 mg/dL — AB
Ketones, ur: NEGATIVE mg/dL
Nitrite: NEGATIVE
PH: 7 (ref 5.0–8.0)
Protein, ur: NEGATIVE mg/dL
Specific Gravity, Urine: >=1.03 (ref 1.005–1.030)
UROBILINOGEN UA: 0.2 mg/dL (ref 0.0–1.0)

## 2013-08-09 NOTE — Progress Notes (Signed)
Patient is doing well without complaints. FM/PTL precautions reviewed. CBGs all except for one value within range on Metformin and glyburide qHS. Congratulated patient on her efforts. Follow up growth scheduled for week of March 9  NST reviewed and reactive

## 2013-08-09 NOTE — Progress Notes (Signed)
Pulse- 80 

## 2013-08-12 ENCOUNTER — Ambulatory Visit (INDEPENDENT_AMBULATORY_CARE_PROVIDER_SITE_OTHER): Payer: Medicaid Other | Admitting: *Deleted

## 2013-08-12 VITALS — BP 123/79 | Wt 232.6 lb

## 2013-08-12 DIAGNOSIS — O24919 Unspecified diabetes mellitus in pregnancy, unspecified trimester: Secondary | ICD-10-CM

## 2013-08-12 NOTE — Progress Notes (Signed)
P=100 NST

## 2013-08-16 ENCOUNTER — Ambulatory Visit (INDEPENDENT_AMBULATORY_CARE_PROVIDER_SITE_OTHER): Payer: Medicaid Other | Admitting: Obstetrics and Gynecology

## 2013-08-16 ENCOUNTER — Ambulatory Visit (HOSPITAL_COMMUNITY)
Admission: RE | Admit: 2013-08-16 | Discharge: 2013-08-16 | Disposition: A | Discharge status: Home / Self Care | Payer: Medicaid Other | Source: Ambulatory Visit | Attending: Obstetrics and Gynecology | Admitting: Obstetrics and Gynecology

## 2013-08-16 ENCOUNTER — Encounter: Payer: Self-pay | Admitting: Obstetrics and Gynecology

## 2013-08-16 VITALS — BP 124/84 | Temp 97.0°F | Wt 226.3 lb

## 2013-08-16 DIAGNOSIS — O24319 Unspecified pre-existing diabetes mellitus in pregnancy, unspecified trimester: Secondary | ICD-10-CM

## 2013-08-16 DIAGNOSIS — N12 Tubulo-interstitial nephritis, not specified as acute or chronic: Secondary | ICD-10-CM

## 2013-08-16 DIAGNOSIS — E119 Type 2 diabetes mellitus without complications: Secondary | ICD-10-CM

## 2013-08-16 DIAGNOSIS — O2390 Unspecified genitourinary tract infection in pregnancy, unspecified trimester: Secondary | ICD-10-CM

## 2013-08-16 DIAGNOSIS — O24919 Unspecified diabetes mellitus in pregnancy, unspecified trimester: Secondary | ICD-10-CM | POA: Insufficient documentation

## 2013-08-16 DIAGNOSIS — O2300 Infections of kidney in pregnancy, unspecified trimester: Secondary | ICD-10-CM

## 2013-08-16 LAB — POCT URINALYSIS DIP (DEVICE)
Bilirubin Urine: NEGATIVE
Glucose, UA: NEGATIVE mg/dL
Hgb urine dipstick: NEGATIVE
Ketones, ur: NEGATIVE mg/dL
NITRITE: NEGATIVE
PH: 6.5 (ref 5.0–8.0)
PROTEIN: 30 mg/dL — AB
Specific Gravity, Urine: 1.025 (ref 1.005–1.030)
UROBILINOGEN UA: 0.2 mg/dL (ref 0.0–1.0)

## 2013-08-16 NOTE — Progress Notes (Signed)
Patient is doing well without complaints. CBGs reviewed and all within range except for 4/7 2hr p dinner values. Patient admits to not always adhering to diet for dinner. Growth ultrasound today- report not available but patient reports EFW at 51%tile. FM/PTL precautions reviewed

## 2013-08-16 NOTE — Progress Notes (Signed)
Pulse- 93 Patient reports pelvic pressure

## 2013-08-16 NOTE — Progress Notes (Signed)
US for growth done today 

## 2013-08-16 NOTE — Progress Notes (Signed)
MFM ultrasound  Indication: 18 yr old G1P0 at 5347w2d with type II diabetes for fetal growth. Remote read.  Findings: 1. Single intrauterine pregnancy. 2. Estimated fetal weight is in the 51st%. 3. Posterior placenta without evidence of previa. 4. Normal amniotic fluid index. 5. The limited anatomy survey is normal.  Recommendations: 1. Appropriate fetal growth. 2. Diabetes: - normal fetal echocardiogram - recommend antenatal testing - recommend fetal growth every 4 weeks  Eulis FosterKristen Tadeusz Stahl, MD

## 2013-08-19 ENCOUNTER — Ambulatory Visit (INDEPENDENT_AMBULATORY_CARE_PROVIDER_SITE_OTHER): Payer: Medicaid Other | Admitting: *Deleted

## 2013-08-19 VITALS — BP 121/74

## 2013-08-19 DIAGNOSIS — O24319 Unspecified pre-existing diabetes mellitus in pregnancy, unspecified trimester: Secondary | ICD-10-CM

## 2013-08-19 DIAGNOSIS — O24919 Unspecified diabetes mellitus in pregnancy, unspecified trimester: Secondary | ICD-10-CM

## 2013-08-19 DIAGNOSIS — E119 Type 2 diabetes mellitus without complications: Secondary | ICD-10-CM

## 2013-08-19 NOTE — Progress Notes (Signed)
P = 113   Pt denies H/A or visual disturbances, repeat BP wnl.

## 2013-08-19 NOTE — Progress Notes (Signed)
NST performed today was reviewed and was found to be reactive.  Continue recommended antenatal testing and prenatal care.  

## 2013-08-23 ENCOUNTER — Ambulatory Visit (INDEPENDENT_AMBULATORY_CARE_PROVIDER_SITE_OTHER): Payer: Medicaid Other | Admitting: Family Medicine

## 2013-08-23 ENCOUNTER — Encounter: Payer: Self-pay | Admitting: Family Medicine

## 2013-08-23 VITALS — BP 124/74 | Wt 225.4 lb

## 2013-08-23 DIAGNOSIS — E119 Type 2 diabetes mellitus without complications: Secondary | ICD-10-CM

## 2013-08-23 DIAGNOSIS — N12 Tubulo-interstitial nephritis, not specified as acute or chronic: Secondary | ICD-10-CM

## 2013-08-23 DIAGNOSIS — O2390 Unspecified genitourinary tract infection in pregnancy, unspecified trimester: Secondary | ICD-10-CM

## 2013-08-23 DIAGNOSIS — O24319 Unspecified pre-existing diabetes mellitus in pregnancy, unspecified trimester: Secondary | ICD-10-CM

## 2013-08-23 DIAGNOSIS — O24919 Unspecified diabetes mellitus in pregnancy, unspecified trimester: Secondary | ICD-10-CM

## 2013-08-23 DIAGNOSIS — O2300 Infections of kidney in pregnancy, unspecified trimester: Secondary | ICD-10-CM

## 2013-08-23 LAB — POCT URINALYSIS DIP (DEVICE)
BILIRUBIN URINE: NEGATIVE
Glucose, UA: 250 mg/dL — AB
HGB URINE DIPSTICK: NEGATIVE
Ketones, ur: NEGATIVE mg/dL
NITRITE: NEGATIVE
Protein, ur: NEGATIVE mg/dL
Specific Gravity, Urine: 1.02 (ref 1.005–1.030)
UROBILINOGEN UA: 0.2 mg/dL (ref 0.0–1.0)
pH: 6 (ref 5.0–8.0)

## 2013-08-23 LAB — US OB FOLLOW UP

## 2013-08-23 MED ORDER — GLYBURIDE 2.5 MG PO TABS: ORAL_TABLET | ORAL | Status: DC

## 2013-08-23 NOTE — Patient Instructions (Signed)
Third Trimester of Pregnancy  The third trimester is from week 29 through week 42, months 7 through 9. The third trimester is a time when the fetus is growing rapidly. At the end of the ninth month, the fetus is about 20 inches in length and weighs 6 10 pounds.   BODY CHANGES  Your body goes through many changes during pregnancy. The changes vary from woman to woman.    Your weight will continue to increase. You can expect to gain 25 35 pounds (11 16 kg) by the end of the pregnancy.   You may begin to get stretch marks on your hips, abdomen, and breasts.   You may urinate more often because the fetus is moving lower into your pelvis and pressing on your bladder.   You may develop or continue to have heartburn as a result of your pregnancy.   You may develop constipation because certain hormones are causing the muscles that push waste through your intestines to slow down.   You may develop hemorrhoids or swollen, bulging veins (varicose veins).   You may have pelvic pain because of the weight gain and pregnancy hormones relaxing your joints between the bones in your pelvis. Back aches may result from over exertion of the muscles supporting your posture.   Your breasts will continue to grow and be tender. A yellow discharge may leak from your breasts called colostrum.   Your belly button may stick out.   You may feel short of breath because of your expanding uterus.   You may notice the fetus "dropping," or moving lower in your abdomen.   You may have a bloody mucus discharge. This usually occurs a few days to a week before labor begins.   Your cervix becomes thin and soft (effaced) near your due date.  WHAT TO EXPECT AT YOUR PRENATAL EXAMS   You will have prenatal exams every 2 weeks until week 36. Then, you will have weekly prenatal exams. During a routine prenatal visit:   You will be weighed to make sure you and the fetus are growing normally.   Your blood pressure is taken.   Your abdomen will be  measured to track your baby's growth.   The fetal heartbeat will be listened to.   Any test results from the previous visit will be discussed.   You may have a cervical check near your due date to see if you have effaced.  At around 36 weeks, your caregiver will check your cervix. At the same time, your caregiver will also perform a test on the secretions of the vaginal tissue. This test is to determine if a type of bacteria, Group B streptococcus, is present. Your caregiver will explain this further.  Your caregiver may ask you:   What your birth plan is.   How you are feeling.   If you are feeling the baby move.   If you have had any abnormal symptoms, such as leaking fluid, bleeding, severe headaches, or abdominal cramping.   If you have any questions.  Other tests or screenings that may be performed during your third trimester include:   Blood tests that check for low iron levels (anemia).   Fetal testing to check the health, activity level, and growth of the fetus. Testing is done if you have certain medical conditions or if there are problems during the pregnancy.  FALSE LABOR  You may feel small, irregular contractions that eventually go away. These are called Braxton Hicks contractions, or   false labor. Contractions may last for hours, days, or even weeks before true labor sets in. If contractions come at regular intervals, intensify, or become painful, it is best to be seen by your caregiver.   SIGNS OF LABOR    Menstrual-like cramps.   Contractions that are 5 minutes apart or less.   Contractions that start on the top of the uterus and spread down to the lower abdomen and back.   A sense of increased pelvic pressure or back pain.   A watery or bloody mucus discharge that comes from the vagina.  If you have any of these signs before the 37th week of pregnancy, call your caregiver right away. You need to go to the hospital to get checked immediately.  HOME CARE INSTRUCTIONS    Avoid all  smoking, herbs, alcohol, and unprescribed drugs. These chemicals affect the formation and growth of the baby.   Follow your caregiver's instructions regarding medicine use. There are medicines that are either safe or unsafe to take during pregnancy.   Exercise only as directed by your caregiver. Experiencing uterine cramps is a good sign to stop exercising.   Continue to eat regular, healthy meals.   Wear a good support bra for breast tenderness.   Do not use hot tubs, steam rooms, or saunas.   Wear your seat belt at all times when driving.   Avoid raw meat, uncooked cheese, cat litter boxes, and soil used by cats. These carry germs that can cause birth defects in the baby.   Take your prenatal vitamins.   Try taking a stool softener (if your caregiver approves) if you develop constipation. Eat more high-fiber foods, such as fresh vegetables or fruit and whole grains. Drink plenty of fluids to keep your urine clear or pale yellow.   Take warm sitz baths to soothe any pain or discomfort caused by hemorrhoids. Use hemorrhoid cream if your caregiver approves.   If you develop varicose veins, wear support hose. Elevate your feet for 15 minutes, 3 4 times a day. Limit salt in your diet.   Avoid heavy lifting, wear low heal shoes, and practice good posture.   Rest a lot with your legs elevated if you have leg cramps or low back pain.   Visit your dentist if you have not gone during your pregnancy. Use a soft toothbrush to brush your teeth and be gentle when you floss.   A sexual relationship may be continued unless your caregiver directs you otherwise.   Do not travel far distances unless it is absolutely necessary and only with the approval of your caregiver.   Take prenatal classes to understand, practice, and ask questions about the labor and delivery.   Make a trial run to the hospital.   Pack your hospital bag.   Prepare the baby's nursery.   Continue to go to all your prenatal visits as directed  by your caregiver.  SEEK MEDICAL CARE IF:   You are unsure if you are in labor or if your water has broken.   You have dizziness.   You have mild pelvic cramps, pelvic pressure, or nagging pain in your abdominal area.   You have persistent nausea, vomiting, or diarrhea.   You have a bad smelling vaginal discharge.   You have pain with urination.  SEEK IMMEDIATE MEDICAL CARE IF:    You have a fever.   You are leaking fluid from your vagina.   You have spotting or bleeding from your vagina.     You have severe abdominal cramping or pain.   You have rapid weight loss or gain.   You have shortness of breath with chest pain.   You notice sudden or extreme swelling of your face, hands, ankles, feet, or legs.   You have not felt your baby move in over an hour.   You have severe headaches that do not go away with medicine.   You have vision changes.  Document Released: 05/21/2001 Document Revised: 01/27/2013 Document Reviewed: 07/28/2012  [employer] Patient Information 2014 [employer]

## 2013-08-23 NOTE — Progress Notes (Signed)
S: 18 yo G1 @ 3853w2d here for ROBV. Class B DM and hx of pyelo Fasting: 79-90 Pp: 95-129 (dinner all above 120)    A/P NST- reactive. 140s, mod var. +accels.  Cat I tracing.  Will increase to BID glyburide 2.5mg  for evening sugars On keflex for suppression F/u in 2 weeks.

## 2013-08-23 NOTE — Progress Notes (Signed)
P = 92 

## 2013-08-26 ENCOUNTER — Ambulatory Visit (INDEPENDENT_AMBULATORY_CARE_PROVIDER_SITE_OTHER): Payer: Medicaid Other | Admitting: General Practice

## 2013-08-26 VITALS — BP 130/77 | Wt 231.6 lb

## 2013-08-26 DIAGNOSIS — O24919 Unspecified diabetes mellitus in pregnancy, unspecified trimester: Secondary | ICD-10-CM

## 2013-08-26 NOTE — Progress Notes (Signed)
Pulse: 86

## 2013-08-30 ENCOUNTER — Telehealth: Payer: Self-pay | Admitting: *Deleted

## 2013-08-30 ENCOUNTER — Ambulatory Visit (INDEPENDENT_AMBULATORY_CARE_PROVIDER_SITE_OTHER): Payer: Medicaid Other | Admitting: Family Medicine

## 2013-08-30 VITALS — BP 121/77

## 2013-08-30 DIAGNOSIS — O24319 Unspecified pre-existing diabetes mellitus in pregnancy, unspecified trimester: Secondary | ICD-10-CM

## 2013-08-30 DIAGNOSIS — O2300 Infections of kidney in pregnancy, unspecified trimester: Secondary | ICD-10-CM

## 2013-08-30 DIAGNOSIS — O24919 Unspecified diabetes mellitus in pregnancy, unspecified trimester: Secondary | ICD-10-CM

## 2013-08-30 DIAGNOSIS — O2390 Unspecified genitourinary tract infection in pregnancy, unspecified trimester: Secondary | ICD-10-CM

## 2013-08-30 DIAGNOSIS — E119 Type 2 diabetes mellitus without complications: Secondary | ICD-10-CM

## 2013-08-30 DIAGNOSIS — N12 Tubulo-interstitial nephritis, not specified as acute or chronic: Secondary | ICD-10-CM

## 2013-08-30 LAB — US OB FOLLOW UP

## 2013-08-30 NOTE — Patient Instructions (Signed)
Third Trimester of Pregnancy The third trimester is from week 29 through week 42, months 7 through 9. The third trimester is a time when the fetus is growing rapidly. At the end of the ninth month, the fetus is about 20 inches in length and weighs 6 10 pounds.  BODY CHANGES Your body goes through many changes during pregnancy. The changes vary from woman to woman.   Your weight will continue to increase. You can expect to gain 25 35 pounds (11 16 kg) by the end of the pregnancy.  You may begin to get stretch marks on your hips, abdomen, and breasts.  You may urinate more often because the fetus is moving lower into your pelvis and pressing on your bladder.  You may develop or continue to have heartburn as a result of your pregnancy.  You may develop constipation because certain hormones are causing the muscles that push waste through your intestines to slow down.  You may develop hemorrhoids or swollen, bulging veins (varicose veins).  You may have pelvic pain because of the weight gain and pregnancy hormones relaxing your joints between the bones in your pelvis. Back aches may result from over exertion of the muscles supporting your posture.  Your breasts will continue to grow and be tender. A yellow discharge may leak from your breasts called colostrum.  Your belly button may stick out.  You may feel short of breath because of your expanding uterus.  You may notice the fetus "dropping," or moving lower in your abdomen.  You may have a bloody mucus discharge. This usually occurs a few days to a week before labor begins.  Your cervix becomes thin and soft (effaced) near your due date. WHAT TO EXPECT AT YOUR PRENATAL EXAMS  You will have prenatal exams every 2 weeks until week 36. Then, you will have weekly prenatal exams. During a routine prenatal visit:  You will be weighed to make sure you and the fetus are growing normally.  Your blood pressure is taken.  Your abdomen will  be measured to track your baby's growth.  The fetal heartbeat will be listened to.  Any test results from the previous visit will be discussed.  You may have a cervical check near your due date to see if you have effaced. At around 36 weeks, your caregiver will check your cervix. At the same time, your caregiver will also perform a test on the secretions of the vaginal tissue. This test is to determine if a type of bacteria, Group B streptococcus, is present. Your caregiver will explain this further. Your caregiver may ask you:  What your birth plan is.  How you are feeling.  If you are feeling the baby move.  If you have had any abnormal symptoms, such as leaking fluid, bleeding, severe headaches, or abdominal cramping.  If you have any questions. Other tests or screenings that may be performed during your third trimester include:  Blood tests that check for low iron levels (anemia).  Fetal testing to check the health, activity level, and growth of the fetus. Testing is done if you have certain medical conditions or if there are problems during the pregnancy. FALSE LABOR You may feel small, irregular contractions that eventually go away. These are called Braxton Hicks contractions, or false labor. Contractions may last for hours, days, or even weeks before true labor sets in. If contractions come at regular intervals, intensify, or become painful, it is best to be seen by your caregiver.    SIGNS OF LABOR   Menstrual-like cramps.  Contractions that are 5 minutes apart or less.  Contractions that start on the top of the uterus and spread down to the lower abdomen and back.  A sense of increased pelvic pressure or back pain.  A watery or bloody mucus discharge that comes from the vagina. If you have any of these signs before the 37th week of pregnancy, call your caregiver right away. You need to go to the hospital to get checked immediately. HOME CARE INSTRUCTIONS   Avoid all  smoking, herbs, alcohol, and unprescribed drugs. These chemicals affect the formation and growth of the baby.  Follow your caregiver's instructions regarding medicine use. There are medicines that are either safe or unsafe to take during pregnancy.  Exercise only as directed by your caregiver. Experiencing uterine cramps is a good sign to stop exercising.  Continue to eat regular, healthy meals.  Wear a good support bra for breast tenderness.  Do not use hot tubs, steam rooms, or saunas.  Wear your seat belt at all times when driving.  Avoid raw meat, uncooked cheese, cat litter boxes, and soil used by cats. These carry germs that can cause birth defects in the baby.  Take your prenatal vitamins.  Try taking a stool softener (if your caregiver approves) if you develop constipation. Eat more high-fiber foods, such as fresh vegetables or fruit and whole grains. Drink plenty of fluids to keep your urine clear or pale yellow.  Take warm sitz baths to soothe any pain or discomfort caused by hemorrhoids. Use hemorrhoid cream if your caregiver approves.  If you develop varicose veins, wear support hose. Elevate your feet for 15 minutes, 3 4 times a day. Limit salt in your diet.  Avoid heavy lifting, wear low heal shoes, and practice good posture.  Rest a lot with your legs elevated if you have leg cramps or low back pain.  Visit your dentist if you have not gone during your pregnancy. Use a soft toothbrush to brush your teeth and be gentle when you floss.  A sexual relationship may be continued unless your caregiver directs you otherwise.  Do not travel far distances unless it is absolutely necessary and only with the approval of your caregiver.  Take prenatal classes to understand, practice, and ask questions about the labor and delivery.  Make a trial run to the hospital.  Pack your hospital bag.  Prepare the baby's nursery.  Continue to go to all your prenatal visits as directed  by your caregiver. SEEK MEDICAL CARE IF:  You are unsure if you are in labor or if your water has broken.  You have dizziness.  You have mild pelvic cramps, pelvic pressure, or nagging pain in your abdominal area.  You have persistent nausea, vomiting, or diarrhea.  You have a bad smelling vaginal discharge.  You have pain with urination. SEEK IMMEDIATE MEDICAL CARE IF:   You have a fever.  You are leaking fluid from your vagina.  You have spotting or bleeding from your vagina.  You have severe abdominal cramping or pain.  You have rapid weight loss or gain.  You have shortness of breath with chest pain.  You notice sudden or extreme swelling of your face, hands, ankles, feet, or legs.  You have not felt your baby move in over an hour.  You have severe headaches that do not go away with medicine.  You have vision changes. Document Released: 05/21/2001 Document Revised: 01/27/2013 Document Reviewed:   07/28/2012 [employer] Patient Information 2014 [employer].  Breastfeeding Deciding to breastfeed is one of the best choices you can make for you and your baby. A change in hormones during pregnancy causes your breast tissue to grow and increases the number and size of your milk ducts. These hormones also allow proteins, sugars, and fats from your blood supply to make breast milk in your milk-producing glands. Hormones prevent breast milk from being released before your baby is born as well as prompt milk flow after birth. Once breastfeeding has begun, thoughts of your baby, as well as his or her sucking or crying, can stimulate the release of milk from your milk-producing glands.  BENEFITS OF BREASTFEEDING For Your Baby  Your first milk (colostrum) helps your baby's digestive system function better.   There are antibodies in your milk that help your baby fight off infections.   Your baby has a lower incidence of asthma, allergies, and sudden infant death syndrome.    The nutrients in breast milk are better for your baby than infant formulas and are designed uniquely for your baby's needs.   Breast milk improves your baby's brain development.   Your baby is less likely to develop other conditions, such as childhood obesity, asthma, or type 2 diabetes mellitus.  For You   Breastfeeding helps to create a very special bond between you and your baby.   Breastfeeding is convenient. Breast milk is always available at the correct temperature and costs nothing.   Breastfeeding helps to burn calories and helps you lose the weight gained during pregnancy.   Breastfeeding makes your uterus contract to its prepregnancy size faster and slows bleeding (lochia) after you give birth.   Breastfeeding helps to lower your risk of developing type 2 diabetes mellitus, osteoporosis, and breast or ovarian cancer later in life. SIGNS THAT YOUR BABY IS HUNGRY Early Signs of Hunger  Increased alertness or activity.  Stretching.  Movement of the head from side to side.  Movement of the head and opening of the mouth when the corner of the mouth or cheek is stroked (rooting).  Increased sucking sounds, smacking lips, cooing, sighing, or squeaking.  Hand-to-mouth movements.  Increased sucking of fingers or hands. Late Signs of Hunger  Fussing.  Intermittent crying. Extreme Signs of Hunger Signs of extreme hunger will require calming and consoling before your baby will be able to breastfeed successfully. Do not wait for the following signs of extreme hunger to occur before you initiate breastfeeding:   Restlessness.  A loud, strong cry.   Screaming. BREASTFEEDING BASICS Breastfeeding Initiation  Find a comfortable place to sit or lie down, with your neck and back well supported.  Place a pillow or rolled up blanket under your baby to bring him or her to the level of your breast (if you are seated). Nursing pillows are specially designed to help  support your arms and your baby while you breastfeed.  Make sure that your baby's abdomen is facing your abdomen.   Gently massage your breast. With your fingertips, massage from your chest wall toward your nipple in a circular motion. This encourages milk flow. You may need to continue this action during the feeding if your milk flows slowly.  Support your breast with 4 fingers underneath and your thumb above your nipple. Make sure your fingers are well away from your nipple and your baby's mouth.   Stroke your baby's lips gently with your finger or nipple.   When your baby's mouth is   open wide enough, quickly bring your baby to your breast, placing your entire nipple and as much of the colored area around your nipple (areola) as possible into your baby's mouth.   More areola should be visible above your baby's upper lip than below the lower lip.   Your baby's tongue should be between his or her lower gum and your breast.   Ensure that your baby's mouth is correctly positioned around your nipple (latched). Your baby's lips should create a seal on your breast and be turned out (everted).  It is common for your baby to suck about 2 3 minutes in order to start the flow of breast milk. Latching Teaching your baby how to latch on to your breast properly is very important. An improper latch can cause nipple pain and decreased milk supply for you and poor weight gain in your baby. Also, if your baby is not latched onto your nipple properly, he or she may swallow some air during feeding. This can make your baby fussy. Burping your baby when you switch breasts during the feeding can help to get rid of the air. However, teaching your baby to latch on properly is still the best way to prevent fussiness from swallowing air while breastfeeding. Signs that your baby has successfully latched on to your nipple:    Silent tugging or silent sucking, without causing you pain.   Swallowing heard  between every 3 4 sucks.    Muscle movement above and in front of his or her ears while sucking.  Signs that your baby has not successfully latched on to nipple:   Sucking sounds or smacking sounds from your baby while breastfeeding.  Nipple pain. If you think your baby has not latched on correctly, slip your finger into the corner of your baby's mouth to break the suction and place it between your baby's gums. Attempt breastfeeding initiation again. Signs of Successful Breastfeeding Signs from your baby:   A gradual decrease in the number of sucks or complete cessation of sucking.   Falling asleep.   Relaxation of his or her body.   Retention of a small amount of milk in his or her mouth.   Letting go of your breast by himself or herself. Signs from you:  Breasts that have increased in firmness, weight, and size 1 3 hours after feeding.   Breasts that are softer immediately after breastfeeding.  Increased milk volume, as well as a change in milk consistency and color by the 5th day of breastfeeding.   Nipples that are not sore, cracked, or bleeding. Signs That Your Baby is Getting Enough Milk  Wetting at least 3 diapers in a 24-hour period. The urine should be clear and pale yellow by age 5 days.  At least 3 stools in a 24-hour period by age 5 days. The stool should be soft and yellow.  At least 3 stools in a 24-hour period by age 7 days. The stool should be seedy and yellow.  No loss of weight greater than 10% of birth weight during the first 3 days of age.  Average weight gain of 4 7 ounces (120 210 mL) per week after age 4 days.  Consistent daily weight gain by age 5 days, without weight loss after the age of 2 weeks. After a feeding, your baby may spit up a small amount. This is common. BREASTFEEDING FREQUENCY AND DURATION Frequent feeding will help you make more milk and can prevent sore nipples and breast engorgement.   Breastfeed when you feel the need to  reduce the fullness of your breasts or when your baby shows signs of hunger. This is called "breastfeeding on demand." Avoid introducing a pacifier to your baby while you are working to establish breastfeeding (the first 4 6 weeks after your baby is born). After this time you may choose to use a pacifier. Research has shown that pacifier use during the first year of a baby's life decreases the risk of sudden infant death syndrome (SIDS). Allow your baby to feed on each breast as long as he or she wants. Breastfeed until your baby is finished feeding. When your baby unlatches or falls asleep while feeding from the first breast, offer the second breast. Because newborns are often sleepy in the first few weeks of life, you may need to awaken your baby to get him or her to feed. Breastfeeding times will vary from baby to baby. However, the following rules can serve as a guide to help you ensure that your baby is properly fed:  Newborns (babies 4 weeks of age or younger) may breastfeed every 1 3 hours.  Newborns should not go longer than 3 hours during the day or 5 hours during the night without breastfeeding.  You should breastfeed your baby a minimum of 8 times in a 24-hour period until you begin to introduce solid foods to your baby at around 6 months of age. BREAST MILK PUMPING Pumping and storing breast milk allows you to ensure that your baby is exclusively fed your breast milk, even at times when you are unable to breastfeed. This is especially important if you are going back to work while you are still breastfeeding or when you are not able to be present during feedings. Your lactation consultant can give you guidelines on how long it is safe to store breast milk.  A breast pump is a machine that allows you to pump milk from your breast into a sterile bottle. The pumped breast milk can then be stored in a refrigerator or freezer. Some breast pumps are operated by hand, while others use electricity. Ask  your lactation consultant which type will work best for you. Breast pumps can be purchased, but some hospitals and breastfeeding support groups lease breast pumps on a monthly basis. A lactation consultant can teach you how to hand express breast milk, if you prefer not to use a pump.  CARING FOR YOUR BREASTS WHILE YOU BREASTFEED Nipples can become dry, cracked, and sore while breastfeeding. The following recommendations can help keep your breasts moisturized and healthy:  Avoid using soap on your nipples.   Wear a supportive bra. Although not required, special nursing bras and tank tops are designed to allow access to your breasts for breastfeeding without taking off your entire bra or top. Avoid wearing underwire style bras or extremely tight bras.  Air dry your nipples for 3 4minutes after each feeding.   Use only cotton bra pads to absorb leaked breast milk. Leaking of breast milk between feedings is normal.   Use lanolin on your nipples after breastfeeding. Lanolin helps to maintain your skin's normal moisture barrier. If you use pure lanolin you do not need to wash it off before feeding your baby again. Pure lanolin is not toxic to your baby. You may also hand express a few drops of breast milk and gently massage that milk into your nipples and allow the milk to air dry. In the first few weeks after giving birth, some women   experience extremely full breasts (engorgement). Engorgement can make your breasts feel heavy, warm, and tender to the touch. Engorgement peaks within 3 5 days after you give birth. The following recommendations can help ease engorgement:  Completely empty your breasts while breastfeeding or pumping. You may want to start by applying warm, moist heat (in the shower or with warm water-soaked hand towels) just before feeding or pumping. This increases circulation and helps the milk flow. If your baby does not completely empty your breasts while breastfeeding, pump any extra  milk after he or she is finished.  Wear a snug bra (nursing or regular) or tank top for 1 2 days to signal your body to slightly decrease milk production.  Apply ice packs to your breasts, unless this is too uncomfortable for you.  Make sure that your baby is latched on and positioned properly while breastfeeding. If engorgement persists after 48 hours of following these recommendations, contact your health care provider or a lactation consultant. OVERALL HEALTH CARE RECOMMENDATIONS WHILE BREASTFEEDING  Eat healthy foods. Alternate between meals and snacks, eating 3 of each per day. Because what you eat affects your breast milk, some of the foods may make your baby more irritable than usual. Avoid eating these foods if you are sure that they are negatively affecting your baby.  Drink milk, fruit juice, and water to satisfy your thirst (about 10 glasses a day).   Rest often, relax, and continue to take your prenatal vitamins to prevent fatigue, stress, and anemia.  Continue breast self-awareness checks.  Avoid chewing and smoking tobacco.  Avoid alcohol and drug use. Some medicines that may be harmful to your baby can pass through breast milk. It is important to ask your health care provider before taking any medicine, including all over-the-counter and prescription medicine as well as vitamin and herbal supplements. It is possible to become pregnant while breastfeeding. If birth control is desired, ask your health care provider about options that will be safe for your baby. SEEK MEDICAL CARE IF:   You feel like you want to stop breastfeeding or have become frustrated with breastfeeding.  You have painful breasts or nipples.  Your nipples are cracked or bleeding.  Your breasts are red, tender, or warm.  You have a swollen area on either breast.  You have a fever or chills.  You have nausea or vomiting.  You have drainage other than breast milk from your nipples.  Your breasts  do not become full before feedings by the 5th day after you give birth.  You feel sad and depressed.  Your baby is too sleepy to eat well.  Your baby is having trouble sleeping.   Your baby is wetting less than 3 diapers in a 24-hour period.  Your baby has less than 3 stools in a 24-hour period.  Your baby's skin or the white part of his or her eyes becomes yellow.   Your baby is not gaining weight by 5 days of age. SEEK IMMEDIATE MEDICAL CARE IF:   Your baby is overly tired (lethargic) and does not want to wake up and feed.  Your baby develops an unexplained fever. Document Released: 05/27/2005 Document Revised: 01/27/2013 Document Reviewed: 11/18/2012 [employer] Patient Information 2014 [employer].  

## 2013-08-30 NOTE — Telephone Encounter (Addendum)
Called pt regarding her appt for 3/26 - unable to leave a message.  Pt needs to be informed that she is scheduled for both 1pm and 2 pm on 3/26.  We need to know which appt time she wants to keep.  3/25  1645  Spoke w/pt regarding her appt tomorrow. She will come at 1300.

## 2013-08-30 NOTE — Progress Notes (Signed)
P-84  

## 2013-08-30 NOTE — Progress Notes (Signed)
NST reviewed and reactive. FBS 72-86 2 hr pp 92-126 (4 of 14 out of range) BS look good.

## 2013-08-31 LAB — POCT URINALYSIS DIP (DEVICE)
Bilirubin Urine: NEGATIVE
Glucose, UA: 250 mg/dL — AB
Hgb urine dipstick: NEGATIVE
Ketones, ur: NEGATIVE mg/dL
NITRITE: NEGATIVE
PH: 7 (ref 5.0–8.0)
PROTEIN: NEGATIVE mg/dL
Specific Gravity, Urine: 1.025 (ref 1.005–1.030)
Urobilinogen, UA: 0.2 mg/dL (ref 0.0–1.0)

## 2013-09-02 ENCOUNTER — Ambulatory Visit (INDEPENDENT_AMBULATORY_CARE_PROVIDER_SITE_OTHER): Payer: Medicaid Other | Admitting: *Deleted

## 2013-09-02 ENCOUNTER — Other Ambulatory Visit: Payer: Medicaid Other

## 2013-09-02 VITALS — BP 119/75

## 2013-09-02 DIAGNOSIS — O24319 Unspecified pre-existing diabetes mellitus in pregnancy, unspecified trimester: Secondary | ICD-10-CM

## 2013-09-02 DIAGNOSIS — O24919 Unspecified diabetes mellitus in pregnancy, unspecified trimester: Secondary | ICD-10-CM

## 2013-09-02 DIAGNOSIS — E119 Type 2 diabetes mellitus without complications: Secondary | ICD-10-CM

## 2013-09-02 NOTE — Progress Notes (Signed)
NST reviewed and reactive.  Nikeia Henkes L. Harraway-Smith, M.D., FACOG    

## 2013-09-02 NOTE — Progress Notes (Signed)
P = 107 

## 2013-09-03 ENCOUNTER — Other Ambulatory Visit: Payer: Medicaid Other

## 2013-09-05 NOTE — Progress Notes (Signed)
NST reactive.

## 2013-09-06 ENCOUNTER — Ambulatory Visit (INDEPENDENT_AMBULATORY_CARE_PROVIDER_SITE_OTHER): Payer: Medicaid Other | Admitting: Obstetrics & Gynecology

## 2013-09-06 VITALS — BP 126/86 | Wt 232.1 lb

## 2013-09-06 DIAGNOSIS — O24919 Unspecified diabetes mellitus in pregnancy, unspecified trimester: Secondary | ICD-10-CM

## 2013-09-06 DIAGNOSIS — O24319 Unspecified pre-existing diabetes mellitus in pregnancy, unspecified trimester: Secondary | ICD-10-CM

## 2013-09-06 DIAGNOSIS — N12 Tubulo-interstitial nephritis, not specified as acute or chronic: Secondary | ICD-10-CM

## 2013-09-06 DIAGNOSIS — E119 Type 2 diabetes mellitus without complications: Secondary | ICD-10-CM

## 2013-09-06 DIAGNOSIS — O2300 Infections of kidney in pregnancy, unspecified trimester: Secondary | ICD-10-CM

## 2013-09-06 DIAGNOSIS — O2390 Unspecified genitourinary tract infection in pregnancy, unspecified trimester: Secondary | ICD-10-CM

## 2013-09-06 LAB — POCT URINALYSIS DIP (DEVICE)
Bilirubin Urine: NEGATIVE
Glucose, UA: NEGATIVE mg/dL
Ketones, ur: NEGATIVE mg/dL
Nitrite: NEGATIVE
Protein, ur: NEGATIVE mg/dL
SPECIFIC GRAVITY, URINE: 1.025 (ref 1.005–1.030)
UROBILINOGEN UA: 0.2 mg/dL (ref 0.0–1.0)
pH: 7 (ref 5.0–8.0)

## 2013-09-06 LAB — GLUCOSE, CAPILLARY: Glucose-Capillary: 153 mg/dL — ABNORMAL HIGH (ref 70–99)

## 2013-09-06 LAB — HEMOGLOBIN A1C
Hgb A1c MFr Bld: 7 % — ABNORMAL HIGH (ref <5.7)
MEAN PLASMA GLUCOSE: 154 mg/dL — AB (ref <117)

## 2013-09-06 LAB — OB RESULTS CONSOLE GBS: GBS: POSITIVE

## 2013-09-06 NOTE — Progress Notes (Signed)
P= 84 GBS and cultures today.

## 2013-09-06 NOTE — Progress Notes (Signed)
51% at 33 weeks.  Need US at 38 for growth. CBGs--Pt did not bring log and is fasting this morning.  Did not eat breakfast.  Pt refuses to meet with diabetic or nutrition.  Pt encouraged to eat regulalry and take meds.  Fasting is 153.  Will check pp on Thursday and hgb A1C today Cultures today.   Can't tell position on exam.  Will check by US on Thursday with AFI.

## 2013-09-07 LAB — GC/CHLAMYDIA PROBE AMP
CT Probe RNA: NEGATIVE
GC Probe RNA: NEGATIVE

## 2013-09-08 LAB — CULTURE, BETA STREP (GROUP B ONLY)

## 2013-09-08 NOTE — Progress Notes (Signed)
Hgb A1C = 7.0  Pt needs to be counseled about compliance

## 2013-09-09 ENCOUNTER — Ambulatory Visit (INDEPENDENT_AMBULATORY_CARE_PROVIDER_SITE_OTHER): Payer: Medicaid Other | Admitting: *Deleted

## 2013-09-09 VITALS — BP 120/73

## 2013-09-09 DIAGNOSIS — E119 Type 2 diabetes mellitus without complications: Secondary | ICD-10-CM

## 2013-09-09 DIAGNOSIS — O24919 Unspecified diabetes mellitus in pregnancy, unspecified trimester: Secondary | ICD-10-CM

## 2013-09-09 DIAGNOSIS — O24319 Unspecified pre-existing diabetes mellitus in pregnancy, unspecified trimester: Secondary | ICD-10-CM

## 2013-09-09 NOTE — Progress Notes (Signed)
P-96 

## 2013-09-09 NOTE — Progress Notes (Signed)
NST reviewed and reactive.  Amandeep Nesmith L. Harraway-Smith, M.D., FACOG    

## 2013-09-13 ENCOUNTER — Ambulatory Visit (INDEPENDENT_AMBULATORY_CARE_PROVIDER_SITE_OTHER): Payer: Medicaid Other | Admitting: Obstetrics & Gynecology

## 2013-09-13 VITALS — BP 124/85 | Temp 97.7°F | Wt 228.6 lb

## 2013-09-13 DIAGNOSIS — O24919 Unspecified diabetes mellitus in pregnancy, unspecified trimester: Secondary | ICD-10-CM

## 2013-09-13 DIAGNOSIS — E119 Type 2 diabetes mellitus without complications: Secondary | ICD-10-CM

## 2013-09-13 DIAGNOSIS — O2300 Infections of kidney in pregnancy, unspecified trimester: Secondary | ICD-10-CM

## 2013-09-13 DIAGNOSIS — O2390 Unspecified genitourinary tract infection in pregnancy, unspecified trimester: Secondary | ICD-10-CM

## 2013-09-13 DIAGNOSIS — N12 Tubulo-interstitial nephritis, not specified as acute or chronic: Secondary | ICD-10-CM

## 2013-09-13 DIAGNOSIS — O24319 Unspecified pre-existing diabetes mellitus in pregnancy, unspecified trimester: Secondary | ICD-10-CM

## 2013-09-13 LAB — POCT URINALYSIS DIP (DEVICE)
Bilirubin Urine: NEGATIVE
Glucose, UA: NEGATIVE mg/dL
HGB URINE DIPSTICK: NEGATIVE
Nitrite: NEGATIVE
PH: 7 (ref 5.0–8.0)
PROTEIN: NEGATIVE mg/dL
SPECIFIC GRAVITY, URINE: 1.02 (ref 1.005–1.030)
Urobilinogen, UA: 0.2 mg/dL (ref 0.0–1.0)

## 2013-09-13 LAB — US OB FOLLOW UP

## 2013-09-13 NOTE — Progress Notes (Signed)
P=95 C/o of occasional mild lower abdominal/pelvic pressure.

## 2013-09-13 NOTE — Progress Notes (Signed)
NST reactive today  FBS<88,  PP<125 Did not bring meter, will check next visit

## 2013-09-13 NOTE — Patient Instructions (Signed)
Third Trimester of Pregnancy  The third trimester is from week 29 through week 42, months 7 through 9. The third trimester is a time when the fetus is growing rapidly. At the end of the ninth month, the fetus is about 20 inches in length and weighs 6 10 pounds.   BODY CHANGES  Your body goes through many changes during pregnancy. The changes vary from woman to woman.    Your weight will continue to increase. You can expect to gain 25 35 pounds (11 16 kg) by the end of the pregnancy.   You may begin to get stretch marks on your hips, abdomen, and breasts.   You may urinate more often because the fetus is moving lower into your pelvis and pressing on your bladder.   You may develop or continue to have heartburn as a result of your pregnancy.   You may develop constipation because certain hormones are causing the muscles that push waste through your intestines to slow down.   You may develop hemorrhoids or swollen, bulging veins (varicose veins).   You may have pelvic pain because of the weight gain and pregnancy hormones relaxing your joints between the bones in your pelvis. Back aches may result from over exertion of the muscles supporting your posture.   Your breasts will continue to grow and be tender. A yellow discharge may leak from your breasts called colostrum.   Your belly button may stick out.   You may feel short of breath because of your expanding uterus.   You may notice the fetus "dropping," or moving lower in your abdomen.   You may have a bloody mucus discharge. This usually occurs a few days to a week before labor begins.   Your cervix becomes thin and soft (effaced) near your due date.  WHAT TO EXPECT AT YOUR PRENATAL EXAMS   You will have prenatal exams every 2 weeks until week 36. Then, you will have weekly prenatal exams. During a routine prenatal visit:   You will be weighed to make sure you and the fetus are growing normally.   Your blood pressure is taken.   Your abdomen will be  measured to track your baby's growth.   The fetal heartbeat will be listened to.   Any test results from the previous visit will be discussed.   You may have a cervical check near your due date to see if you have effaced.  At around 36 weeks, your caregiver will check your cervix. At the same time, your caregiver will also perform a test on the secretions of the vaginal tissue. This test is to determine if a type of bacteria, Group B streptococcus, is present. Your caregiver will explain this further.  Your caregiver may ask you:   What your birth plan is.   How you are feeling.   If you are feeling the baby move.   If you have had any abnormal symptoms, such as leaking fluid, bleeding, severe headaches, or abdominal cramping.   If you have any questions.  Other tests or screenings that may be performed during your third trimester include:   Blood tests that check for low iron levels (anemia).   Fetal testing to check the health, activity level, and growth of the fetus. Testing is done if you have certain medical conditions or if there are problems during the pregnancy.  FALSE LABOR  You may feel small, irregular contractions that eventually go away. These are called Braxton Hicks contractions, or   false labor. Contractions may last for hours, days, or even weeks before true labor sets in. If contractions come at regular intervals, intensify, or become painful, it is best to be seen by your caregiver.   SIGNS OF LABOR    Menstrual-like cramps.   Contractions that are 5 minutes apart or less.   Contractions that start on the top of the uterus and spread down to the lower abdomen and back.   A sense of increased pelvic pressure or back pain.   A watery or bloody mucus discharge that comes from the vagina.  If you have any of these signs before the 37th week of pregnancy, call your caregiver right away. You need to go to the hospital to get checked immediately.  HOME CARE INSTRUCTIONS    Avoid all  smoking, herbs, alcohol, and unprescribed drugs. These chemicals affect the formation and growth of the baby.   Follow your caregiver's instructions regarding medicine use. There are medicines that are either safe or unsafe to take during pregnancy.   Exercise only as directed by your caregiver. Experiencing uterine cramps is a good sign to stop exercising.   Continue to eat regular, healthy meals.   Wear a good support bra for breast tenderness.   Do not use hot tubs, steam rooms, or saunas.   Wear your seat belt at all times when driving.   Avoid raw meat, uncooked cheese, cat litter boxes, and soil used by cats. These carry germs that can cause birth defects in the baby.   Take your prenatal vitamins.   Try taking a stool softener (if your caregiver approves) if you develop constipation. Eat more high-fiber foods, such as fresh vegetables or fruit and whole grains. Drink plenty of fluids to keep your urine clear or pale yellow.   Take warm sitz baths to soothe any pain or discomfort caused by hemorrhoids. Use hemorrhoid cream if your caregiver approves.   If you develop varicose veins, wear support hose. Elevate your feet for 15 minutes, 3 4 times a day. Limit salt in your diet.   Avoid heavy lifting, wear low heal shoes, and practice good posture.   Rest a lot with your legs elevated if you have leg cramps or low back pain.   Visit your dentist if you have not gone during your pregnancy. Use a soft toothbrush to brush your teeth and be gentle when you floss.   A sexual relationship may be continued unless your caregiver directs you otherwise.   Do not travel far distances unless it is absolutely necessary and only with the approval of your caregiver.   Take prenatal classes to understand, practice, and ask questions about the labor and delivery.   Make a trial run to the hospital.   Pack your hospital bag.   Prepare the baby's nursery.   Continue to go to all your prenatal visits as directed  by your caregiver.  SEEK MEDICAL CARE IF:   You are unsure if you are in labor or if your water has broken.   You have dizziness.   You have mild pelvic cramps, pelvic pressure, or nagging pain in your abdominal area.   You have persistent nausea, vomiting, or diarrhea.   You have a bad smelling vaginal discharge.   You have pain with urination.  SEEK IMMEDIATE MEDICAL CARE IF:    You have a fever.   You are leaking fluid from your vagina.   You have spotting or bleeding from your vagina.     You have severe abdominal cramping or pain.   You have rapid weight loss or gain.   You have shortness of breath with chest pain.   You notice sudden or extreme swelling of your face, hands, ankles, feet, or legs.   You have not felt your baby move in over an hour.   You have severe headaches that do not go away with medicine.   You have vision changes.  Document Released: 05/21/2001 Document Revised: 01/27/2013 Document Reviewed: 07/28/2012  [employer] Patient Information 2014 [employer]

## 2013-09-16 ENCOUNTER — Ambulatory Visit (INDEPENDENT_AMBULATORY_CARE_PROVIDER_SITE_OTHER): Payer: Medicaid Other | Admitting: *Deleted

## 2013-09-16 VITALS — BP 121/81

## 2013-09-16 DIAGNOSIS — E119 Type 2 diabetes mellitus without complications: Secondary | ICD-10-CM

## 2013-09-16 DIAGNOSIS — O24319 Unspecified pre-existing diabetes mellitus in pregnancy, unspecified trimester: Secondary | ICD-10-CM

## 2013-09-16 DIAGNOSIS — O24919 Unspecified diabetes mellitus in pregnancy, unspecified trimester: Secondary | ICD-10-CM

## 2013-09-16 NOTE — Progress Notes (Addendum)
P = 102   IOL scheduled 4/18 @ 1930  Category 1 tracing with baseline in 140s.  Multiple accels.

## 2013-09-17 ENCOUNTER — Telehealth (HOSPITAL_COMMUNITY): Payer: Self-pay | Admitting: *Deleted

## 2013-09-17 NOTE — Telephone Encounter (Signed)
Preadmission screen  

## 2013-09-20 ENCOUNTER — Ambulatory Visit (HOSPITAL_COMMUNITY)
Admission: RE | Admit: 2013-09-20 | Discharge: 2013-09-20 | Disposition: A | Discharge status: Home / Self Care | Payer: Medicaid Other | Source: Ambulatory Visit | Attending: Obstetrics & Gynecology | Admitting: Obstetrics & Gynecology

## 2013-09-20 ENCOUNTER — Ambulatory Visit (INDEPENDENT_AMBULATORY_CARE_PROVIDER_SITE_OTHER): Payer: Medicaid Other | Admitting: Obstetrics & Gynecology

## 2013-09-20 VITALS — BP 121/74 | Wt 229.3 lb

## 2013-09-20 DIAGNOSIS — E119 Type 2 diabetes mellitus without complications: Secondary | ICD-10-CM

## 2013-09-20 DIAGNOSIS — O2390 Unspecified genitourinary tract infection in pregnancy, unspecified trimester: Secondary | ICD-10-CM

## 2013-09-20 DIAGNOSIS — O24919 Unspecified diabetes mellitus in pregnancy, unspecified trimester: Secondary | ICD-10-CM | POA: Insufficient documentation

## 2013-09-20 DIAGNOSIS — O24319 Unspecified pre-existing diabetes mellitus in pregnancy, unspecified trimester: Secondary | ICD-10-CM

## 2013-09-20 DIAGNOSIS — N12 Tubulo-interstitial nephritis, not specified as acute or chronic: Secondary | ICD-10-CM

## 2013-09-20 DIAGNOSIS — O2300 Infections of kidney in pregnancy, unspecified trimester: Secondary | ICD-10-CM

## 2013-09-20 LAB — POCT URINALYSIS DIP (DEVICE)
BILIRUBIN URINE: NEGATIVE
Glucose, UA: 100 mg/dL — AB
HGB URINE DIPSTICK: NEGATIVE
Nitrite: NEGATIVE
PROTEIN: 30 mg/dL — AB
Urobilinogen, UA: 0.2 mg/dL (ref 0.0–1.0)
pH: 6.5 (ref 5.0–8.0)

## 2013-09-20 LAB — GLUCOSE, CAPILLARY: GLUCOSE-CAPILLARY: 175 mg/dL — AB (ref 70–99)

## 2013-09-20 NOTE — Progress Notes (Signed)
Forgot log book, random CBG 175 (patient had large breakfast).  Emphasized diet control and continue Glyburide. Growth scan done today, EFW 6 lbs 11 oz, normal fluid IOL scheduled 4/18 NST performed today was reviewed and was found to be reactive.   No other complaints or concerns.  Fetal movement and labor precautions reviewed.

## 2013-09-20 NOTE — Progress Notes (Signed)
P = 106   US for growth done today.   IOL scheduled on 4/18

## 2013-09-20 NOTE — Patient Instructions (Signed)
Return to clinic for any obstetric concerns or go to [facility] for evaluation  

## 2013-09-23 ENCOUNTER — Ambulatory Visit (INDEPENDENT_AMBULATORY_CARE_PROVIDER_SITE_OTHER): Payer: Medicaid Other | Admitting: *Deleted

## 2013-09-23 VITALS — BP 125/79

## 2013-09-23 DIAGNOSIS — E119 Type 2 diabetes mellitus without complications: Secondary | ICD-10-CM

## 2013-09-23 DIAGNOSIS — O24319 Unspecified pre-existing diabetes mellitus in pregnancy, unspecified trimester: Secondary | ICD-10-CM

## 2013-09-23 DIAGNOSIS — O24919 Unspecified diabetes mellitus in pregnancy, unspecified trimester: Secondary | ICD-10-CM

## 2013-09-23 NOTE — Addendum Note (Signed)
Addended by: Jill SideAY, Zac Torti L on: 09/23/2013 01:43 PM   Modules accepted: Orders

## 2013-09-23 NOTE — Progress Notes (Signed)
NST performed today was reviewed and was found to be reactive.  IOL on 09/25/13.

## 2013-09-23 NOTE — Progress Notes (Signed)
P = 98 

## 2013-09-25 ENCOUNTER — Inpatient Hospital Stay (HOSPITAL_COMMUNITY)
Admission: RE | Admit: 2013-09-25 | Discharge: 2013-09-29 | DRG: 766 | Disposition: A | Discharge status: Home / Self Care | Payer: Medicaid Other | Source: Ambulatory Visit | Attending: Obstetrics & Gynecology | Admitting: Obstetrics & Gynecology

## 2013-09-25 ENCOUNTER — Encounter (HOSPITAL_COMMUNITY): Payer: Self-pay

## 2013-09-25 VITALS — BP 108/72 | HR 71 | Temp 97.5°F | Resp 18 | Ht 67.0 in | Wt 235.6 lb

## 2013-09-25 DIAGNOSIS — E669 Obesity, unspecified: Secondary | ICD-10-CM | POA: Diagnosis present

## 2013-09-25 DIAGNOSIS — O2432 Unspecified pre-existing diabetes mellitus in childbirth: Principal | ICD-10-CM | POA: Diagnosis present

## 2013-09-25 DIAGNOSIS — Z833 Family history of diabetes mellitus: Secondary | ICD-10-CM

## 2013-09-25 DIAGNOSIS — O2300 Infections of kidney in pregnancy, unspecified trimester: Secondary | ICD-10-CM

## 2013-09-25 DIAGNOSIS — O99892 Other specified diseases and conditions complicating childbirth: Secondary | ICD-10-CM | POA: Diagnosis present

## 2013-09-25 DIAGNOSIS — O99214 Obesity complicating childbirth: Secondary | ICD-10-CM

## 2013-09-25 DIAGNOSIS — Z8249 Family history of ischemic heart disease and other diseases of the circulatory system: Secondary | ICD-10-CM

## 2013-09-25 DIAGNOSIS — O24319 Unspecified pre-existing diabetes mellitus in pregnancy, unspecified trimester: Secondary | ICD-10-CM

## 2013-09-25 DIAGNOSIS — E119 Type 2 diabetes mellitus without complications: Secondary | ICD-10-CM | POA: Diagnosis present

## 2013-09-25 DIAGNOSIS — Z22330 Carrier of Group B streptococcus: Secondary | ICD-10-CM

## 2013-09-25 DIAGNOSIS — O9989 Other specified diseases and conditions complicating pregnancy, childbirth and the puerperium: Secondary | ICD-10-CM

## 2013-09-25 DIAGNOSIS — IMO0001 Reserved for inherently not codable concepts without codable children: Secondary | ICD-10-CM | POA: Diagnosis present

## 2013-09-25 LAB — COMPREHENSIVE METABOLIC PANEL
ALT: 9 U/L (ref 0–35)
AST: 13 U/L (ref 0–37)
Albumin: 3 g/dL — ABNORMAL LOW (ref 3.5–5.2)
Alkaline Phosphatase: 105 U/L (ref 39–117)
BUN: 10 mg/dL (ref 6–23)
CALCIUM: 9.3 mg/dL (ref 8.4–10.5)
CO2: 22 mEq/L (ref 19–32)
CREATININE: 0.56 mg/dL (ref 0.50–1.10)
Chloride: 98 mEq/L (ref 96–112)
GFR calc non Af Amer: >90 mL/min (ref >90)
Glucose, Bld: 117 mg/dL — ABNORMAL HIGH (ref 70–99)
Potassium: 4.5 mEq/L (ref 3.7–5.3)
Sodium: 134 mEq/L — ABNORMAL LOW (ref 137–147)
Total Bilirubin: <0.2 mg/dL — ABNORMAL LOW (ref 0.3–1.2)
Total Protein: 6.2 g/dL (ref 6.0–8.3)

## 2013-09-25 LAB — CBC
HCT: 32.6 % — ABNORMAL LOW (ref 36.0–46.0)
Hemoglobin: 11.5 g/dL — ABNORMAL LOW (ref 12.0–15.0)
MCH: 30.7 pg (ref 26.0–34.0)
MCHC: 35.3 g/dL (ref 30.0–36.0)
MCV: 87.2 fL (ref 78.0–100.0)
PLATELETS: 193 10*3/uL (ref 150–400)
RBC: 3.74 MIL/uL — AB (ref 3.87–5.11)
RDW: 12.8 % (ref 11.5–15.5)
WBC: 5.5 10*3/uL (ref 4.0–10.5)

## 2013-09-25 LAB — TYPE AND SCREEN
ABO/RH(D): A POS
Antibody Screen: NEGATIVE

## 2013-09-25 LAB — GLUCOSE, CAPILLARY: Glucose-Capillary: 125 mg/dL — ABNORMAL HIGH (ref 70–99)

## 2013-09-25 LAB — ABO/RH: ABO/RH(D): A POS

## 2013-09-25 MED ORDER — CITRIC ACID-SODIUM CITRATE 334-500 MG/5ML PO SOLN
30.0000 mL | ORAL | Status: DC | PRN
  Administered 2013-09-27: 30 mL via ORAL
  Filled 2013-09-25: qty 15

## 2013-09-25 MED ORDER — FLEET ENEMA 7-19 GM/118ML RE ENEM: 1.0000 | ENEMA | RECTAL | Status: DC | PRN

## 2013-09-25 MED ORDER — ONDANSETRON HCL 4 MG/2ML IJ SOLN: 4.0000 mg | Freq: Four times a day (QID) | INTRAMUSCULAR | Status: DC | PRN

## 2013-09-25 MED ORDER — IBUPROFEN 600 MG PO TABS: 600.0000 mg | ORAL_TABLET | Freq: Four times a day (QID) | ORAL | Status: DC | PRN

## 2013-09-25 MED ORDER — METFORMIN HCL 500 MG PO TABS
1000.0000 mg | ORAL_TABLET | Freq: Two times a day (BID) | ORAL | Status: DC
  Administered 2013-09-26 (×2): 1000 mg via ORAL
  Filled 2013-09-25 (×5): qty 2

## 2013-09-25 MED ORDER — LIDOCAINE HCL (PF) 1 % IJ SOLN
30.0000 mL | INTRAMUSCULAR | Status: DC | PRN
  Filled 2013-09-25 (×2): qty 30

## 2013-09-25 MED ORDER — OXYTOCIN 40 UNITS IN LACTATED RINGERS INFUSION - SIMPLE MED
62.5000 mL/h | INTRAVENOUS | Status: DC
  Filled 2013-09-25: qty 1000

## 2013-09-25 MED ORDER — OXYTOCIN BOLUS FROM INFUSION: 500.0000 mL | INTRAVENOUS | Status: DC

## 2013-09-25 MED ORDER — ACETAMINOPHEN 325 MG PO TABS: 650.0000 mg | ORAL_TABLET | ORAL | Status: DC | PRN

## 2013-09-25 MED ORDER — LACTATED RINGERS IV SOLN
500.0000 mL | INTRAVENOUS | Status: DC | PRN
  Administered 2013-09-27: 500 mL via INTRAVENOUS

## 2013-09-25 MED ORDER — TERBUTALINE SULFATE 1 MG/ML IJ SOLN: 0.2500 mg | Freq: Once | INTRAMUSCULAR | Status: AC | PRN

## 2013-09-25 MED ORDER — OXYCODONE-ACETAMINOPHEN 5-325 MG PO TABS: 1.0000 | ORAL_TABLET | ORAL | Status: DC | PRN

## 2013-09-25 MED ORDER — MISOPROSTOL 25 MCG QUARTER TABLET
25.0000 ug | ORAL_TABLET | ORAL | Status: DC | PRN
  Administered 2013-09-25 – 2013-09-26 (×2): 25 ug via VAGINAL
  Filled 2013-09-25 (×2): qty 0.25

## 2013-09-25 MED ORDER — LACTATED RINGERS IV SOLN
INTRAVENOUS | Status: DC
  Administered 2013-09-25: 20:00:00 via INTRAVENOUS
  Administered 2013-09-26: 1000 mL via INTRAVENOUS
  Administered 2013-09-26 – 2013-09-27 (×3): via INTRAVENOUS

## 2013-09-25 MED ORDER — PENICILLIN G POTASSIUM 5000000 UNITS IJ SOLR
5.0000 10*6.[IU] | Freq: Once | INTRAVENOUS | Status: DC
  Filled 2013-09-25: qty 5

## 2013-09-25 MED ORDER — DEXTROSE 5 % IV SOLN
2.5000 10*6.[IU] | INTRAVENOUS | Status: DC
  Filled 2013-09-25 (×2): qty 2.5

## 2013-09-25 MED ORDER — GLYBURIDE 2.5 MG PO TABS
2.5000 mg | ORAL_TABLET | Freq: Every day | ORAL | Status: AC
  Administered 2013-09-25: 2.5 mg via ORAL
  Filled 2013-09-25: qty 1

## 2013-09-25 NOTE — H&P (Addendum)
Diana Snyder is a 18 y.o. G1P0 female presenting for induction of labor for type 2 DM (non compliant).  No complaints.  BP noted to be elevated but pt has no symptoms of gest htn  History OB History   Grav Para Term Preterm Abortions TAB SAB Ect Mult Living   1              Past Medical History  Diagnosis Date  . Diabetes mellitus without complication    History reviewed. No pertinent past surgical history. Family History: family history includes Diabetes in her mother; Hypertension in her father, maternal grandfather, and mother. Social History:  reports that she has never smoked. She has never used smokeless tobacco. She reports that she does not drink alcohol or use illicit drugs.   Prenatal Transfer Tool  Maternal Diabetes: Yes:  Diabetes Type:  Insulin/Medication controlled Genetic Screening: Declined (pt initiated care at 22 weeks) Maternal Ultrasounds/Referrals: Normal Fetal Ultrasounds or other Referrals:  Fetal echo Maternal Substance Abuse:  No Significant Maternal Medications:  Meds include: Other: Glyburide Significant Maternal Lab Results:  Lab values include: Group B Strep positive Other Comments:  EFW 6 pounds 11 ounces  Review of Systems  Constitutional: Negative.   Respiratory: Negative.   Cardiovascular: Negative.   Gastrointestinal: Negative.   Genitourinary: Negative.   Neurological: Negative.     Dilation: 1 Effacement (%): Thick Station: -3 Exam by:: C. Blackstock, RN Blood pressure 144/94, pulse 86, temperature 98.5 F (36.9 C), temperature source Oral, resp. rate 18, height 5\' 7"  (1.702 m), weight 229 lb 4.8 oz (104.01 kg), last menstrual period 12/26/2012. Exam Physical Exam  Vitals reviewed. Constitutional: She is oriented to person, place, and time. She appears well-developed and well-nourished. No distress.  HENT:  Head: Normocephalic and atraumatic.  Eyes: Conjunctivae are normal. No scleral icterus.  Neck: No thyromegaly present.   Cardiovascular: Normal rate and regular rhythm.   Respiratory: Effort normal and breath sounds normal.  GI: Soft. There is no rebound and no guarding.  Genitourinary: Vagina normal.  Musculoskeletal: Normal range of motion. She exhibits no edema and no tenderness.  Neurological: She is alert and oriented to person, place, and time.  Skin: Skin is warm and dry.  Psychiatric: She has a normal mood and affect.    Prenatal labs: ABO, Rh: A/Positive/-- (12/02 0000) Antibody: Negative (12/02 0000) Rubella: Immune (12/02 0000) RPR: NON REAC (01/26 1126)  HBsAg: Negative (12/02 0000)  HIV: NON REACTIVE (01/26 1126)  GBS: Positive (03/30 0000)   Assessment/Plan: 18 yoi G1P0 at 39 weeks with Type 2 diabetes (not well controlled and not compliant)  1-Check GHTN labs 2-cytotec induction 3-PCN for GBS 4-nml dose glyburide tonight, will continue metformin for now as well.   Lesly DukesKelly H Antwoine Zorn 09/25/2013, 8:49 PM

## 2013-09-26 ENCOUNTER — Inpatient Hospital Stay (HOSPITAL_COMMUNITY): Payer: Medicaid Other | Admitting: Anesthesiology

## 2013-09-26 ENCOUNTER — Encounter (HOSPITAL_COMMUNITY): Payer: Self-pay

## 2013-09-26 ENCOUNTER — Encounter (HOSPITAL_COMMUNITY): Payer: Medicaid Other | Admitting: Anesthesiology

## 2013-09-26 LAB — CBC
HCT: 33.6 % — ABNORMAL LOW (ref 36.0–46.0)
HEMOGLOBIN: 11.7 g/dL — AB (ref 12.0–15.0)
MCH: 30.2 pg (ref 26.0–34.0)
MCHC: 34.8 g/dL (ref 30.0–36.0)
MCV: 86.8 fL (ref 78.0–100.0)
PLATELETS: 175 10*3/uL (ref 150–400)
RBC: 3.87 MIL/uL (ref 3.87–5.11)
RDW: 12.9 % (ref 11.5–15.5)
WBC: 8.2 10*3/uL (ref 4.0–10.5)

## 2013-09-26 LAB — GLUCOSE, CAPILLARY
GLUCOSE-CAPILLARY: 120 mg/dL — AB (ref 70–99)
Glucose-Capillary: 109 mg/dL — ABNORMAL HIGH (ref 70–99)
Glucose-Capillary: 132 mg/dL — ABNORMAL HIGH (ref 70–99)
Glucose-Capillary: 139 mg/dL — ABNORMAL HIGH (ref 70–99)
Glucose-Capillary: 70 mg/dL (ref 70–99)
Glucose-Capillary: 78 mg/dL (ref 70–99)
Glucose-Capillary: 98 mg/dL (ref 70–99)

## 2013-09-26 LAB — PROTEIN / CREATININE RATIO, URINE
Creatinine, Urine: 134.41 mg/dL
PROTEIN CREATININE RATIO: 0.13 (ref 0.00–0.15)
TOTAL PROTEIN, URINE: 17.8 mg/dL

## 2013-09-26 LAB — RPR

## 2013-09-26 MED ORDER — OXYTOCIN 40 UNITS IN LACTATED RINGERS INFUSION - SIMPLE MED
1.0000 m[IU]/min | INTRAVENOUS | Status: DC
  Administered 2013-09-26: 12 m[IU]/min via INTRAVENOUS
  Administered 2013-09-26: 10 m[IU]/min via INTRAVENOUS

## 2013-09-26 MED ORDER — FENTANYL CITRATE 0.05 MG/ML IJ SOLN
100.0000 ug | INTRAMUSCULAR | Status: DC | PRN
  Administered 2013-09-26 (×3): 100 ug via INTRAVENOUS
  Filled 2013-09-26 (×3): qty 2

## 2013-09-26 MED ORDER — DEXTROSE 5 % IV SOLN
2.5000 10*6.[IU] | INTRAVENOUS | Status: DC
  Administered 2013-09-26 – 2013-09-27 (×7): 2.5 10*6.[IU] via INTRAVENOUS
  Filled 2013-09-26 (×12): qty 2.5

## 2013-09-26 MED ORDER — FENTANYL 2.5 MCG/ML BUPIVACAINE 1/10 % EPIDURAL INFUSION (WH - ANES)
INTRAMUSCULAR | Status: AC
  Filled 2013-09-26: qty 125

## 2013-09-26 MED ORDER — PHENYLEPHRINE 40 MCG/ML (10ML) SYRINGE FOR IV PUSH (FOR BLOOD PRESSURE SUPPORT)
80.0000 ug | PREFILLED_SYRINGE | INTRAVENOUS | Status: DC | PRN
  Filled 2013-09-26: qty 10

## 2013-09-26 MED ORDER — LACTATED RINGERS IV SOLN: 500.0000 mL | Freq: Once | INTRAVENOUS | Status: DC

## 2013-09-26 MED ORDER — DEXTROSE 5 % IV SOLN
5.0000 10*6.[IU] | Freq: Once | INTRAVENOUS | Status: AC
  Administered 2013-09-26: 5 10*6.[IU] via INTRAVENOUS
  Filled 2013-09-26: qty 5

## 2013-09-26 MED ORDER — FENTANYL 2.5 MCG/ML BUPIVACAINE 1/10 % EPIDURAL INFUSION (WH - ANES)
INTRAMUSCULAR | Status: DC | PRN
  Administered 2013-09-26: 14 mL/h via EPIDURAL

## 2013-09-26 MED ORDER — FENTANYL 2.5 MCG/ML BUPIVACAINE 1/10 % EPIDURAL INFUSION (WH - ANES)
14.0000 mL/h | INTRAMUSCULAR | Status: DC | PRN
  Administered 2013-09-27 (×2): 14 mL/h via EPIDURAL
  Filled 2013-09-26 (×2): qty 125

## 2013-09-26 MED ORDER — EPHEDRINE 5 MG/ML INJ: 10.0000 mg | INTRAVENOUS | Status: DC | PRN

## 2013-09-26 MED ORDER — DIPHENHYDRAMINE HCL 50 MG/ML IJ SOLN: 12.5000 mg | INTRAMUSCULAR | Status: DC | PRN

## 2013-09-26 MED ORDER — OXYTOCIN 40 UNITS IN LACTATED RINGERS INFUSION - SIMPLE MED
1.0000 m[IU]/min | INTRAVENOUS | Status: DC
  Administered 2013-09-26: 7 m[IU]/min via INTRAVENOUS
  Administered 2013-09-26: 8 m[IU]/min via INTRAVENOUS
  Administered 2013-09-26: 6 m[IU]/min via INTRAVENOUS

## 2013-09-26 MED ORDER — TERBUTALINE SULFATE 1 MG/ML IJ SOLN: 0.2500 mg | Freq: Once | INTRAMUSCULAR | Status: AC | PRN

## 2013-09-26 MED ORDER — OXYTOCIN 40 UNITS IN LACTATED RINGERS INFUSION - SIMPLE MED
1.0000 m[IU]/min | INTRAVENOUS | Status: DC
  Administered 2013-09-26: 1 m[IU]/min via INTRAVENOUS
  Filled 2013-09-26: qty 1000

## 2013-09-26 MED ORDER — OXYTOCIN 10 UNIT/ML IJ SOLN
INTRAMUSCULAR | Status: AC
  Filled 2013-09-26: qty 2

## 2013-09-26 MED ORDER — PHENYLEPHRINE 40 MCG/ML (10ML) SYRINGE FOR IV PUSH (FOR BLOOD PRESSURE SUPPORT): 80.0000 ug | PREFILLED_SYRINGE | INTRAVENOUS | Status: DC | PRN

## 2013-09-26 MED ORDER — EPHEDRINE 5 MG/ML INJ
INTRAVENOUS | Status: AC
  Filled 2013-09-26: qty 4

## 2013-09-26 MED ORDER — EPHEDRINE 5 MG/ML INJ
10.0000 mg | INTRAVENOUS | Status: DC | PRN
  Filled 2013-09-26: qty 4

## 2013-09-26 MED ORDER — LIDOCAINE HCL (PF) 1 % IJ SOLN
INTRAMUSCULAR | Status: DC | PRN
  Administered 2013-09-26 (×2): 8 mL

## 2013-09-26 MED ORDER — PHENYLEPHRINE 40 MCG/ML (10ML) SYRINGE FOR IV PUSH (FOR BLOOD PRESSURE SUPPORT)
PREFILLED_SYRINGE | INTRAVENOUS | Status: AC
  Filled 2013-09-26: qty 10

## 2013-09-26 NOTE — Progress Notes (Signed)
Patient ID: Diana Snyder, female   DOB: 02/11/1996, [age] y.o.   MRN: [id number] Diana Portelaasticia C Slawson is a 18 y.o. G1P0 at 5192w1d  admitted for induction of labor due to Type2DM, non-compliant.  Has had some elevated bp's since arrival, normal labs.   Subjective: FB out and SROM'd before lunch  Objective: BP 138/96  Pulse 78  Temp(Src) 98.1 F (36.7 C) (Oral)  Resp 18  Ht 5\' 7"  (1.702 m)  Wt 106.85 kg (235 lb 9 oz)  BMI 36.89 kg/m2  LMP 12/26/2012    FHT:  FHR: 140s bpm, variability: moderate,  accelerations:  Present,  decelerations:  Absent UC:   Regular 2  SVE:   Dilation: 5 Effacement (%): 50 Station: -2 Exam by:: Dherr rn    Labs: Lab Results  Component Value Date   WBC 5.5 09/25/2013   HGB 11.5* 09/25/2013   HCT 32.6* 09/25/2013   MCV 87.2 09/25/2013   PLT 193 09/25/2013   CBG (last 3)   Recent Labs  09/26/13 0812 09/26/13 1222 09/26/13 1607  GLUCAP 78 120* 98     Assessment / Plan: IOL d/t noncompliant Type2DM, also w/ some mildly elevated bp's since arrival-normal pre-e labs, s/p cytotec x 2, now cervical foley bulb in place  Labor: FB out, continue PIt Fetal Wellbeing:  Category I Pain Control:  Fentanyl PRN Pre-eclampsia: pre-e labs normal I/D:  pcn for gbs pos Anticipated MOD:  NSVD  Tawana ScaleMichael Ryan Orine Goga, MD OB Fellow 4:14 PM

## 2013-09-26 NOTE — Progress Notes (Signed)
Patient ID: Diana Snyder, female   DOB: 09/29/1995, 18 y.o.   MRN: [id number] Diana Portelaasticia C Lobosco is a 18 y.o. G1P0 at 6230w1d  admitted for induction of labor due to Type2DM, non-compliant.  Has had some elevated bp's since arrival, normal labs.   Subjective: FB out, Edit from prior note - not Ruptured. Miscommunication between staff.   Objective: BP 126/77  Pulse 80  Temp(Src) 98.5 F (36.9 C) (Oral)  Resp 18  Ht 5\' 7"  (1.702 m)  Wt 106.85 kg (235 lb 9 oz)  BMI 36.89 kg/m2  SpO2 99%  LMP 12/26/2012    FHT:  FHR: 140s bpm, variability: moderate,  accelerations:  Present,  decelerations:  Absent UC:   irRegular 2-4  SVE:   Dilation: 5 Effacement (%): 60 Station: -2 Exam by:: Dr Ike Benedom    Labs: Lab Results  Component Value Date   WBC 8.2 09/26/2013   HGB 11.7* 09/26/2013   HCT 33.6* 09/26/2013   MCV 86.8 09/26/2013   PLT 175 09/26/2013   CBG (last 3)   Recent Labs  09/26/13 0812 09/26/13 1222 09/26/13 1607  GLUCAP 78 120* 98     Assessment / Plan: IOL d/t noncompliant Type2DM, also w/ some mildly elevated bp's since arrival-normal pre-e labs, s/p cytotec x 2, now cervical foley bulb in place  Labor: FB out, continue Pit, consider AROM when having adequate contraction pattern Fetal Wellbeing:  Category I Pain Control:  Fentanyl PRN Pre-eclampsia: pre-e labs normal I/D:  pcn for gbs pos Anticipated MOD:  NSVD  Tawana ScaleMichael Ryan Augustino Savastano, MD OB Fellow 6:42 PM

## 2013-09-26 NOTE — Anesthesia Procedure Notes (Signed)
Epidural Patient location during procedure: OB Start time: 09/26/2013 5:49 PM End time: 09/26/2013 5:53 PM  Staffing Anesthesiologist: Leilani AbleHATCHETT, Zuly Belkin Performed by: anesthesiologist   Preanesthetic Checklist Completed: patient identified, surgical consent, pre-op evaluation, timeout performed, IV checked, risks and benefits discussed and monitors and equipment checked  Epidural Patient position: sitting Prep: site prepped and draped and DuraPrep Patient monitoring: continuous pulse ox and blood pressure Approach: midline Location: L3-L4 Injection technique: LOR air  Needle:  Needle type: Tuohy  Needle gauge: 17 G Needle length: 9 cm and 9 Needle insertion depth: 7 cm Catheter type: closed end flexible Catheter size: 19 Gauge Catheter at skin depth: 12 cm Test dose: negative and Other  Assessment Sensory level: T9 Events: blood not aspirated, injection not painful, no injection resistance, negative IV test and no paresthesia  Additional Notes Reason for block:procedure for pain

## 2013-09-26 NOTE — Progress Notes (Signed)
Patient ID: Gretchen Portelaasticia C Rayo, female   DOB: 05/29/1996, 18 y.o.   MRN: [id number] Gretchen Portelaasticia C Maturin is a 18 y.o. G1P0 at 139w1d  admitted for induction of labor due to Type2DM, non-compliant.  Has had some elevated bp's since arrival, normal labs.   Subjective: Comfortable, received pain medicine earlier. Denies ha, scotomata, ruq/epigastric pain, n/v.    Objective: BP 132/89  Pulse 74  Temp(Src) 98.6 F (37 C) (Oral)  Resp 16  Ht 5\' 7"  (1.702 m)  Wt 106.85 kg (235 lb 9 oz)  BMI 36.89 kg/m2  LMP 12/26/2012    FHT:  FHR: 140 bpm, variability: moderate,  accelerations:  Present,  decelerations:  Absent UC:   Regular 1-3  SVE:   Dilation: 1 Effacement (%): 60 Station: -3 Exam by:: Genella RifeK. Havanah Nelms, CNM Cervical foley bulb inserted and inflated w/ 60ml LR w/o difficulty   Labs: Lab Results  Component Value Date   WBC 5.5 09/25/2013   HGB 11.5* 09/25/2013   HCT 32.6* 09/25/2013   MCV 87.2 09/25/2013   PLT 193 09/25/2013   CBG (last 3)   Recent Labs  09/25/13 2001 09/26/13 09/26/13 0401  GLUCAP 125* 139* 70     Assessment / Plan: IOL d/t noncompliant Type2DM, also w/ some mildly elevated bp's since arrival-normal pre-e labs, s/p cytotec x 2, now cervical foley bulb in place  Labor: n/a Fetal Wellbeing:  Category I Pain Control:  Fentanyl Pre-eclampsia: pre-e labs normal I/D:  pcn for gbs pos Anticipated MOD:  NSVD  Marge DuncansKimberly Randall Pahoua Schreiner CNM, WHNP-BC 09/26/2013, 6:46 AM

## 2013-09-26 NOTE — Progress Notes (Signed)
Diana Snyder is a 18 y.o. G1P0 at 6761w1d admitted for induction of labor due to A2DM.  Subjective: Doing well. Water just broke, clear fluid. +FM.   Objective: BP 135/85  Pulse 77  Temp(Src) 98.1 F (36.7 C) (Oral)  Resp 16  Ht 5\' 7"  (1.702 m)  Wt 106.85 kg (235 lb 9 oz)  BMI 36.89 kg/m2  SpO2 100%  LMP 12/26/2012      FHT:  FHR: 160 bpm, variability: moderate,  accelerations:  Present,  decelerations:  Present variables UC:   irregular, every 1-5 minutes SVE:   Dilation: 4 Effacement (%): 50 Station: -3 Exam by:: Dr. Reola CalkinsBeck  Labs: Lab Results  Component Value Date   WBC 8.2 09/26/2013   HGB 11.7* 09/26/2013   HCT 33.6* 09/26/2013   MCV 86.8 09/26/2013   PLT 175 09/26/2013    Assessment / Plan: IOL due to A2DM and on pitocin now with SROM  Labor: SROM. IUPC placed without difficulty.  cont to monitor and icnrease pit ot adequacy gHTN:  no severe range Fetal Wellbeing:  Category II Pain Control:  Epidural I/D:  pos on PCN Anticipated MOD:  NSVD  Diana Snyder 09/26/2013, 10:33 PM

## 2013-09-26 NOTE — Progress Notes (Signed)
Patient ID: Diana Snyder, female   DOB: 02/04/1996, 18 y.o.   MRN: [id number] Diana Snyder is a 18 y.o. G1P0 at 8026w1d  admitted for induction of labor due to Type2DM, non-compliant.  Has had some elevated bp's since arrival, normal labs.   Subjective: Comfortable, fb in place. Pt sleeping when I entered room  Objective: BP 131/77  Pulse 69  Temp(Src) 98 F (36.7 C) (Oral)  Resp 20  Ht 5\' 7"  (1.702 m)  Wt 106.85 kg (235 lb 9 oz)  BMI 36.89 kg/m2  LMP 12/26/2012    FHT:  FHR: 130s bpm, variability: moderate,  accelerations:  Present,  decelerations:  Absent UC:   Regular 1-3  SVE:   Dilation: 1 Effacement (%): 60 Station: -3 Exam by:: Genella RifeK. Booker, CNM FB in place not reexamined.   Labs: Lab Results  Component Value Date   WBC 5.5 09/25/2013   HGB 11.5* 09/25/2013   HCT 32.6* 09/25/2013   MCV 87.2 09/25/2013   PLT 193 09/25/2013   CBG (last 3)   Recent Labs  09/26/13 0401 09/26/13 0812 09/26/13 1222  GLUCAP 70 78 120*     Assessment / Plan: IOL d/t noncompliant Type2DM, also w/ some mildly elevated bp's since arrival-normal pre-e labs, s/p cytotec x 2, now cervical foley bulb in place  Labor: FB in place, Fetal Wellbeing:  Category I Pain Control:  Fentanyl PRN Pre-eclampsia: pre-e labs normal I/D:  pcn for gbs pos Anticipated MOD:  NSVD  Tawana ScaleMichael Ryan Felisha Claytor, MD OB Fellow 12:35 PM

## 2013-09-26 NOTE — Anesthesia Preprocedure Evaluation (Signed)
Anesthesia Evaluation  Patient identified by MRN, date of birth, ID band Patient awake    Reviewed: Allergy & Precautions, H&P , NPO status , Patient's Chart, lab work & pertinent test results  Airway Mallampati: II TM Distance: >3 FB Neck ROM: full    Dental no notable dental hx.    Pulmonary neg pulmonary ROS,  breath sounds clear to auscultation  Pulmonary exam normal       Cardiovascular negative cardio ROS      Neuro/Psych negative neurological ROS  negative psych ROS   GI/Hepatic negative GI ROS, Neg liver ROS,   Endo/Other  diabetes, Gestational, Oral Hypoglycemic AgentsMorbid obesity  Renal/GU      Musculoskeletal   Abdominal (+) - obese,   Peds  Hematology negative hematology ROS (+)   Anesthesia Other Findings   Reproductive/Obstetrics (+) Pregnancy                           Anesthesia Physical Anesthesia Plan  ASA: III  Anesthesia Plan: Epidural   Post-op Pain Management:    Induction:   Airway Management Planned:   Additional Equipment:   Intra-op Plan:   Post-operative Plan:   Informed Consent: I have reviewed the patients History and Physical, chart, labs and discussed the procedure including the risks, benefits and alternatives for the proposed anesthesia with the patient or authorized representative who has indicated his/her understanding and acceptance.     Plan Discussed with:   Anesthesia Plan Comments:         Anesthesia Quick Evaluation

## 2013-09-27 ENCOUNTER — Encounter (HOSPITAL_COMMUNITY)
Admission: RE | Disposition: A | Discharge status: Home / Self Care | Payer: Self-pay | Source: Ambulatory Visit | Attending: Obstetrics & Gynecology

## 2013-09-27 ENCOUNTER — Encounter (HOSPITAL_COMMUNITY): Payer: Self-pay

## 2013-09-27 DIAGNOSIS — O2432 Unspecified pre-existing diabetes mellitus in childbirth: Secondary | ICD-10-CM

## 2013-09-27 DIAGNOSIS — E119 Type 2 diabetes mellitus without complications: Secondary | ICD-10-CM

## 2013-09-27 LAB — GLUCOSE, CAPILLARY
GLUCOSE-CAPILLARY: 115 mg/dL — AB (ref 70–99)
GLUCOSE-CAPILLARY: 142 mg/dL — AB (ref 70–99)
GLUCOSE-CAPILLARY: 152 mg/dL — AB (ref 70–99)
Glucose-Capillary: 144 mg/dL — ABNORMAL HIGH (ref 70–99)
Glucose-Capillary: 150 mg/dL — ABNORMAL HIGH (ref 70–99)
Glucose-Capillary: 148 mg/dL — ABNORMAL HIGH (ref 70–99)
Glucose-Capillary: 138 mg/dL — ABNORMAL HIGH (ref 70–99)
Glucose-Capillary: 117 mg/dL — ABNORMAL HIGH (ref 70–99)
Glucose-Capillary: 114 mg/dL — ABNORMAL HIGH (ref 70–99)
Glucose-Capillary: 143 mg/dL — ABNORMAL HIGH (ref 70–99)

## 2013-09-27 SURGERY — Surgical Case
Anesthesia: Epidural | Site: Abdomen

## 2013-09-27 MED ORDER — FENTANYL CITRATE 0.05 MG/ML IJ SOLN
25.0000 ug | INTRAMUSCULAR | Status: DC | PRN
Start: 2013-09-27 — End: 2013-09-27
  Administered 2013-09-27 (×2): 50 ug via INTRAVENOUS

## 2013-09-27 MED ORDER — KETOROLAC TROMETHAMINE 30 MG/ML IJ SOLN: 30.0000 mg | Freq: Four times a day (QID) | INTRAMUSCULAR | Status: AC | PRN

## 2013-09-27 MED ORDER — SODIUM CHLORIDE 0.9 % IV SOLN: INTRAVENOUS | Status: DC

## 2013-09-27 MED ORDER — TETANUS-DIPHTH-ACELL PERTUSSIS 5-2.5-18.5 LF-MCG/0.5 IM SUSP
0.5000 mL | Freq: Once | INTRAMUSCULAR | Status: DC
  Filled 2013-09-27: qty 0.5

## 2013-09-27 MED ORDER — LIDOCAINE-EPINEPHRINE (PF) 2 %-1:200000 IJ SOLN
INTRAMUSCULAR | Status: DC | PRN
  Administered 2013-09-27: 10 mL via EPIDURAL
  Administered 2013-09-27: 1 mL via EPIDURAL
  Administered 2013-09-27: 10 mL via EPIDURAL
  Administered 2013-09-27: 5 mL via EPIDURAL
  Administered 2013-09-27: 4 mL via EPIDURAL

## 2013-09-27 MED ORDER — METFORMIN HCL 500 MG PO TABS
500.0000 mg | ORAL_TABLET | Freq: Two times a day (BID) | ORAL | Status: DC
  Administered 2013-09-27 – 2013-09-29 (×4): 500 mg via ORAL
  Filled 2013-09-27 (×6): qty 1

## 2013-09-27 MED ORDER — IBUPROFEN 600 MG PO TABS
600.0000 mg | ORAL_TABLET | Freq: Four times a day (QID) | ORAL | Status: DC
Start: 2013-09-28 — End: 2013-09-29
  Administered 2013-09-27 – 2013-09-29 (×7): 600 mg via ORAL
  Filled 2013-09-27 (×7): qty 1

## 2013-09-27 MED ORDER — PROMETHAZINE HCL 25 MG/ML IJ SOLN: 6.2500 mg | INTRAMUSCULAR | Status: DC | PRN

## 2013-09-27 MED ORDER — NALBUPHINE HCL 10 MG/ML IJ SOLN
5.0000 mg | INTRAMUSCULAR | Status: DC | PRN
  Administered 2013-09-27: 10 mg via INTRAVENOUS
  Filled 2013-09-27: qty 1

## 2013-09-27 MED ORDER — NALBUPHINE HCL 10 MG/ML IJ SOLN: 5.0000 mg | INTRAMUSCULAR | Status: DC | PRN

## 2013-09-27 MED ORDER — IBUPROFEN 600 MG PO TABS: 600.0000 mg | ORAL_TABLET | Freq: Four times a day (QID) | ORAL | Status: DC | PRN

## 2013-09-27 MED ORDER — LIDOCAINE-EPINEPHRINE (PF) 2 %-1:200000 IJ SOLN
INTRAMUSCULAR | Status: AC
  Filled 2013-09-27: qty 20

## 2013-09-27 MED ORDER — MEPERIDINE HCL 25 MG/ML IJ SOLN: 6.2500 mg | INTRAMUSCULAR | Status: DC | PRN

## 2013-09-27 MED ORDER — MENTHOL 3 MG MT LOZG: 1.0000 | LOZENGE | OROMUCOSAL | Status: DC | PRN

## 2013-09-27 MED ORDER — DEXTROSE 5 % IV SOLN: 1.0000 ug/kg/h | INTRAVENOUS | Status: DC | PRN

## 2013-09-27 MED ORDER — DEXTROSE-NACL 5-0.45 % IV SOLN
INTRAVENOUS | Status: DC
  Administered 2013-09-27: 10:00:00 via INTRAVENOUS

## 2013-09-27 MED ORDER — FLEET ENEMA 7-19 GM/118ML RE ENEM: 1.0000 | ENEMA | Freq: Every day | RECTAL | Status: DC | PRN

## 2013-09-27 MED ORDER — SENNOSIDES-DOCUSATE SODIUM 8.6-50 MG PO TABS
2.0000 | ORAL_TABLET | ORAL | Status: DC
  Administered 2013-09-27 – 2013-09-29 (×2): 2 via ORAL
  Filled 2013-09-27 (×2): qty 2

## 2013-09-27 MED ORDER — OXYCODONE-ACETAMINOPHEN 5-325 MG PO TABS
1.0000 | ORAL_TABLET | ORAL | Status: DC | PRN
  Administered 2013-09-28 (×2): 1 via ORAL
  Administered 2013-09-29: 2 via ORAL
  Administered 2013-09-29: 1 via ORAL
  Filled 2013-09-27: qty 1
  Filled 2013-09-27: qty 2
  Filled 2013-09-27 (×2): qty 1

## 2013-09-27 MED ORDER — NALOXONE HCL 0.4 MG/ML IJ SOLN: 0.4000 mg | INTRAMUSCULAR | Status: DC | PRN

## 2013-09-27 MED ORDER — ONDANSETRON HCL 4 MG/2ML IJ SOLN: 4.0000 mg | INTRAMUSCULAR | Status: DC | PRN

## 2013-09-27 MED ORDER — DIBUCAINE 1 % RE OINT: 1.0000 "application " | TOPICAL_OINTMENT | RECTAL | Status: DC | PRN

## 2013-09-27 MED ORDER — 0.9 % SODIUM CHLORIDE (POUR BTL) OPTIME
TOPICAL | Status: DC | PRN
Start: 2013-09-27 — End: 2013-09-27
  Administered 2013-09-27: 1000 mL

## 2013-09-27 MED ORDER — OXYTOCIN 40 UNITS IN LACTATED RINGERS INFUSION - SIMPLE MED
INTRAVENOUS | Status: DC | PRN
  Administered 2013-09-27: 40 [IU] via INTRAVENOUS

## 2013-09-27 MED ORDER — LACTATED RINGERS IV SOLN
INTRAVENOUS | Status: DC
  Administered 2013-09-27: 19:00:00 via INTRAVENOUS

## 2013-09-27 MED ORDER — DEXTROSE 50 % IV SOLN: 25.0000 mL | INTRAVENOUS | Status: DC | PRN

## 2013-09-27 MED ORDER — INSULIN REGULAR BOLUS VIA INFUSION
0.0000 [IU] | Freq: Three times a day (TID) | INTRAVENOUS | Status: DC
  Filled 2013-09-27: qty 10

## 2013-09-27 MED ORDER — SODIUM BICARBONATE 8.4 % IV SOLN
INTRAVENOUS | Status: AC
  Filled 2013-09-27: qty 50

## 2013-09-27 MED ORDER — DIPHENHYDRAMINE HCL 50 MG/ML IJ SOLN: 25.0000 mg | INTRAMUSCULAR | Status: DC | PRN

## 2013-09-27 MED ORDER — BISACODYL 10 MG RE SUPP: 10.0000 mg | Freq: Every day | RECTAL | Status: DC | PRN

## 2013-09-27 MED ORDER — FENTANYL CITRATE 0.05 MG/ML IJ SOLN
INTRAMUSCULAR | Status: AC
  Administered 2013-09-27: 50 ug via INTRAVENOUS
  Filled 2013-09-27: qty 2

## 2013-09-27 MED ORDER — SIMETHICONE 80 MG PO CHEW
80.0000 mg | CHEWABLE_TABLET | ORAL | Status: DC
  Administered 2013-09-27 – 2013-09-29 (×2): 80 mg via ORAL
  Filled 2013-09-27 (×2): qty 1

## 2013-09-27 MED ORDER — ACETAMINOPHEN 500 MG PO TABS
1000.0000 mg | ORAL_TABLET | Freq: Four times a day (QID) | ORAL | Status: AC
  Administered 2013-09-27 – 2013-09-28 (×2): 1000 mg via ORAL
  Filled 2013-09-27 (×3): qty 2

## 2013-09-27 MED ORDER — MORPHINE SULFATE (PF) 0.5 MG/ML IJ SOLN
INTRAMUSCULAR | Status: DC | PRN
  Administered 2013-09-27: 1 mg via INTRAVENOUS

## 2013-09-27 MED ORDER — DIPHENHYDRAMINE HCL 25 MG PO CAPS: 25.0000 mg | ORAL_CAPSULE | Freq: Four times a day (QID) | ORAL | Status: DC | PRN

## 2013-09-27 MED ORDER — MORPHINE SULFATE 0.5 MG/ML IJ SOLN
INTRAMUSCULAR | Status: AC
  Filled 2013-09-27: qty 10

## 2013-09-27 MED ORDER — ONDANSETRON HCL 4 MG/2ML IJ SOLN
INTRAMUSCULAR | Status: AC
  Filled 2013-09-27: qty 2

## 2013-09-27 MED ORDER — MIDAZOLAM HCL 2 MG/2ML IJ SOLN: 0.5000 mg | Freq: Once | INTRAMUSCULAR | Status: DC | PRN

## 2013-09-27 MED ORDER — SODIUM CHLORIDE 0.9 % IV SOLN
INTRAVENOUS | Status: DC
  Administered 2013-09-27: 0.9 [IU]/h via INTRAVENOUS
  Filled 2013-09-27: qty 1

## 2013-09-27 MED ORDER — MORPHINE SULFATE (PF) 0.5 MG/ML IJ SOLN
INTRAMUSCULAR | Status: DC | PRN
  Administered 2013-09-27: 4 mg via EPIDURAL

## 2013-09-27 MED ORDER — SIMETHICONE 80 MG PO CHEW: 80.0000 mg | CHEWABLE_TABLET | ORAL | Status: DC | PRN

## 2013-09-27 MED ORDER — LACTATED RINGERS IV SOLN
INTRAVENOUS | Status: DC | PRN
  Administered 2013-09-27 (×3): via INTRAVENOUS

## 2013-09-27 MED ORDER — CHLOROPROCAINE HCL 3 % IJ SOLN
INTRAMUSCULAR | Status: AC
  Filled 2013-09-27: qty 20

## 2013-09-27 MED ORDER — SCOPOLAMINE 1 MG/3DAYS TD PT72
MEDICATED_PATCH | TRANSDERMAL | Status: AC
  Filled 2013-09-27: qty 1

## 2013-09-27 MED ORDER — METOCLOPRAMIDE HCL 5 MG/ML IJ SOLN: 10.0000 mg | Freq: Three times a day (TID) | INTRAMUSCULAR | Status: DC | PRN

## 2013-09-27 MED ORDER — OXYTOCIN 40 UNITS IN LACTATED RINGERS INFUSION - SIMPLE MED: 62.5000 mL/h | INTRAVENOUS | Status: AC

## 2013-09-27 MED ORDER — ZOLPIDEM TARTRATE 5 MG PO TABS: 5.0000 mg | ORAL_TABLET | Freq: Every evening | ORAL | Status: DC | PRN

## 2013-09-27 MED ORDER — DIPHENHYDRAMINE HCL 25 MG PO CAPS: 25.0000 mg | ORAL_CAPSULE | ORAL | Status: DC | PRN

## 2013-09-27 MED ORDER — METHYLERGONOVINE MALEATE 0.2 MG/ML IJ SOLN
INTRAMUSCULAR | Status: DC | PRN
  Administered 2013-09-27: 0.2 mg via INTRAMUSCULAR

## 2013-09-27 MED ORDER — SIMETHICONE 80 MG PO CHEW
80.0000 mg | CHEWABLE_TABLET | Freq: Three times a day (TID) | ORAL | Status: DC
  Administered 2013-09-27 – 2013-09-29 (×6): 80 mg via ORAL
  Filled 2013-09-27 (×6): qty 1

## 2013-09-27 MED ORDER — PRENATAL MULTIVITAMIN CH
1.0000 | ORAL_TABLET | Freq: Every day | ORAL | Status: DC
  Administered 2013-09-28 – 2013-09-29 (×2): 1 via ORAL
  Filled 2013-09-27 (×2): qty 1

## 2013-09-27 MED ORDER — LANOLIN HYDROUS EX OINT: 1.0000 "application " | TOPICAL_OINTMENT | CUTANEOUS | Status: DC | PRN

## 2013-09-27 MED ORDER — CEFAZOLIN SODIUM-DEXTROSE 2-3 GM-% IV SOLR
2.0000 g | Freq: Once | INTRAVENOUS | Status: AC
  Administered 2013-09-27: 2 g via INTRAVENOUS
  Filled 2013-09-27: qty 50

## 2013-09-27 MED ORDER — ONDANSETRON HCL 4 MG/2ML IJ SOLN: 4.0000 mg | Freq: Three times a day (TID) | INTRAMUSCULAR | Status: DC | PRN

## 2013-09-27 MED ORDER — ONDANSETRON HCL 4 MG/2ML IJ SOLN
INTRAMUSCULAR | Status: DC | PRN
  Administered 2013-09-27: 4 mg via INTRAVENOUS

## 2013-09-27 MED ORDER — ONDANSETRON HCL 4 MG PO TABS
4.0000 mg | ORAL_TABLET | ORAL | Status: DC | PRN
  Administered 2013-09-29: 4 mg via ORAL
  Filled 2013-09-27: qty 1

## 2013-09-27 MED ORDER — METHYLERGONOVINE MALEATE 0.2 MG/ML IJ SOLN
INTRAMUSCULAR | Status: AC
  Filled 2013-09-27: qty 1

## 2013-09-27 MED ORDER — OXYTOCIN 10 UNIT/ML IJ SOLN
INTRAMUSCULAR | Status: AC
  Filled 2013-09-27: qty 4

## 2013-09-27 MED ORDER — SODIUM CHLORIDE 0.9 % IJ SOLN: 3.0000 mL | INTRAMUSCULAR | Status: DC | PRN

## 2013-09-27 MED ORDER — DIPHENHYDRAMINE HCL 50 MG/ML IJ SOLN: 12.5000 mg | INTRAMUSCULAR | Status: DC | PRN

## 2013-09-27 MED ORDER — SCOPOLAMINE 1 MG/3DAYS TD PT72
1.0000 | MEDICATED_PATCH | Freq: Once | TRANSDERMAL | Status: DC
Start: 2013-09-27 — End: 2013-09-29
  Administered 2013-09-27: 1.5 mg via TRANSDERMAL

## 2013-09-27 MED ORDER — WITCH HAZEL-GLYCERIN EX PADS: 1.0000 "application " | MEDICATED_PAD | CUTANEOUS | Status: DC | PRN

## 2013-09-27 SURGICAL SUPPLY — 38 items
BENZOIN TINCTURE PRP APPL 2/3 (GAUZE/BANDAGES/DRESSINGS) ×3 IMPLANT
CLAMP CORD UMBIL (MISCELLANEOUS) IMPLANT
CLOSURE WOUND 1/2 X4 (GAUZE/BANDAGES/DRESSINGS) ×1
CLOTH BEACON ORANGE TIMEOUT ST (SAFETY) ×3 IMPLANT
CONTAINER PREFILL 10% NBF 15ML (MISCELLANEOUS) IMPLANT
DRAIN JACKSON PRT FLT 7MM (DRAIN) IMPLANT
DRAPE LG THREE QUARTER DISP (DRAPES) IMPLANT
DRSG OPSITE POSTOP 4X10 (GAUZE/BANDAGES/DRESSINGS) ×3 IMPLANT
DURAPREP 26ML APPLICATOR (WOUND CARE) ×3 IMPLANT
ELECT REM PT RETURN 9FT ADLT (ELECTROSURGICAL) ×3
ELECTRODE REM PT RTRN 9FT ADLT (ELECTROSURGICAL) ×1 IMPLANT
EVACUATOR SILICONE 100CC (DRAIN) IMPLANT
EXTRACTOR VACUUM M CUP 4 TUBE (SUCTIONS) ×2 IMPLANT
EXTRACTOR VACUUM M CUP 4' TUBE (SUCTIONS) ×1
GLOVE BIO SURGEON STRL SZ7 (GLOVE) ×3 IMPLANT
GLOVE BIOGEL PI IND STRL 7.0 (GLOVE) ×1 IMPLANT
GLOVE BIOGEL PI INDICATOR 7.0 (GLOVE) ×2
GLOVE ECLIPSE 7.0 STRL STRAW (GLOVE) ×6 IMPLANT
GOWN SPEC L3 MED W/TWL (GOWN DISPOSABLE) ×3 IMPLANT
GOWN STRL REUS W/TWL LRG LVL3 (GOWN DISPOSABLE) ×9 IMPLANT
KIT ABG SYR 3ML LUER SLIP (SYRINGE) ×3 IMPLANT
NEEDLE HYPO 25X5/8 SAFETYGLIDE (NEEDLE) ×3 IMPLANT
NS IRRIG 1000ML POUR BTL (IV SOLUTION) ×3 IMPLANT
PACK C SECTION WH (CUSTOM PROCEDURE TRAY) ×3 IMPLANT
PAD ABD 8X7 1/2 STERILE (GAUZE/BANDAGES/DRESSINGS) ×3 IMPLANT
PAD OB MATERNITY 4.3X12.25 (PERSONAL CARE ITEMS) ×3 IMPLANT
RTRCTR C-SECT PINK 25CM LRG (MISCELLANEOUS) ×3 IMPLANT
SPONGE GAUZE 4X4 12PLY STER LF (GAUZE/BANDAGES/DRESSINGS) ×6 IMPLANT
SPONGE LAP 18X18 X RAY DECT (DISPOSABLE) ×3 IMPLANT
STAPLER VISISTAT 35W (STAPLE) IMPLANT
STRIP CLOSURE SKIN 1/2X4 (GAUZE/BANDAGES/DRESSINGS) ×2 IMPLANT
SUT PLAIN 2 0 XLH (SUTURE) ×3 IMPLANT
SUT VIC AB 0 CTX 36 (SUTURE) ×8
SUT VIC AB 0 CTX36XBRD ANBCTRL (SUTURE) ×4 IMPLANT
SUT VIC AB 4-0 KS 27 (SUTURE) ×3 IMPLANT
TOWEL OR 17X24 6PK STRL BLUE (TOWEL DISPOSABLE) ×12 IMPLANT
TRAY FOLEY CATH 14FR (SET/KITS/TRAYS/PACK) IMPLANT
WATER STERILE IRR 1000ML POUR (IV SOLUTION) IMPLANT

## 2013-09-27 NOTE — Op Note (Signed)
Present for Procedure  Attestation of Attending Supervision of Fellow: Evaluation and management procedures were performed by the Fellow under my supervision and collaboration. I have reviewed the Fellow's note and chart, and I agree with the management and plan.

## 2013-09-27 NOTE — Lactation Note (Signed)
This note was copied from the chart of Diana Snyder. Lactation Consultation Note  Patient Name: Diana Wilburt Finlayasticia Nettle ZOXWR'UToday's Date: 09/27/2013 Reason for consult: Other (Comment) (charting for exclusion)   Maternal Data Formula Feeding for Exclusion: Yes Reason for exclusion: Mother's choice to formula feed on admision Infant to breast within first hour of birth: No Breastfeeding delayed due to:: Other (comment) (N/A)  Feeding Feeding Type: Formula Nipple Type: Slow - flow  LATCH Score/Interventions                      Lactation Tools Discussed/Used     Consult Status Consult Status: Complete    Zara ChessJoanne P Daylyn Azbill 09/27/2013, 3:19 PM

## 2013-09-27 NOTE — Progress Notes (Signed)
Diana Snyder is a 18 y.o. G1P0 at 6081w2d admitted for induction of labor due to A2DM.  Subjective:  Sleeping now. No complaints.   Objective: BP 136/83  Pulse 69  Temp(Src) 98.1 F (36.7 C) (Oral)  Resp 16  Ht 5\' 7"  (1.702 m)  Wt 106.85 kg (235 lb 9 oz)  BMI 36.89 kg/m2  SpO2 100%  LMP 12/26/2012      FHT:  FHR: 150 bpm, variability: moderate,  accelerations:  Present,  decelerations:  Absent UC:   Every 1.5-4. MVU ~170 SVE:   Dilation: 4 Effacement (%): 50 Station: -3 Exam by:: Dr. Reola CalkinsBeck  Labs: Lab Results  Component Value Date   WBC 8.2 09/26/2013   HGB 11.7* 09/26/2013   HCT 33.6* 09/26/2013   MCV 86.8 09/26/2013   PLT 175 09/26/2013    Assessment / Plan: IOL for A2DM. progressing slowly  Labor: progressing. stopped pitocin due to irregular pattern. now contracting in a more regular pattern off pitocin completely A2DM:  sugars under control Fetal Wellbeing:  Category I Pain Control:  Epidural I/D:  GBS pos- no PCN Anticipated MOD:  NSVD  Diana Snyder 09/27/2013, 1:13 AM

## 2013-09-27 NOTE — Progress Notes (Signed)
Diana Snyder is a 18 y.o. G1P0 at 6741w2d admitted for induction of labor due to Gestational diabetes.  Subjective: Pt comfortable with epidural.  Family at bedside for support.  Objective: BP 138/81  Pulse 65  Temp(Src) 98.3 F (36.8 C) (Oral)  Resp 20  Ht 5\' 7"  (1.702 m)  Wt 106.85 kg (235 lb 9 oz)  BMI 36.89 kg/m2  SpO2 97%  LMP 12/26/2012 I/O last 3 completed shifts: In: -  Out: 1550 [Urine:1550] Total I/O In: -  Out: 250 [Urine:250]  FHT:  FHR: 140 bpm, variability: moderate,  accelerations:  Present,  decelerations:  Absent UC:   irregular, every 2-5 minutes SVE:   Dilation: 8 Effacement (%): 70 Station: 0 Exam by:: Dr. Penne LashLeggett and  Leftwich-Kirby, CNM   Pt repositioned to Right lateral exaggerated sims, following position change, FHR with late decelerations x5 minutes, resolving with iv fluids and O2.  Pt remains in exaggerated sims position at this time.    Labs: Lab Results  Component Value Date   WBC 8.2 09/26/2013   HGB 11.7* 09/26/2013   HCT 33.6* 09/26/2013   MCV 86.8 09/26/2013   PLT 175 09/26/2013    Assessment / Plan: Arrest in active phase of labor  Labor: Plan to reevaluate with Dr Penne LashLeggett in 2 hours Preeclampsia:  n/a Fetal Wellbeing:  Category II Pain Control:  Epidural I/D:  n/a Anticipated MOD:  undetermined  Misty StanleyLisa A Leftwich-Kirby 09/27/2013, 11:01 AM

## 2013-09-27 NOTE — Progress Notes (Signed)
Patient ID: Diana Snyder, female   DOB: 09/21/1995, 18 y.o.   MRN: [id number]   Pt is 8/70/0 vertex (LOP).  No change for 2 hours, caput is growing.  IUPC is probably not working correctly.  There will be periods of clear q minute ctx with adequacy then almost no ctx of substance.    Given growing caput, cervical swelling, and lack of progression, a c/s would be best route of delivery at this point.    The risks of cesarean section discussed with the patient included but were not limited to: bleeding which may require transfusion or reoperation; infection which may require antibiotics; injury to bowel, bladder, ureters or other surrounding organs; injury to the fetus; need for additional procedures including hysterectomy in the event of a life-threatening hemorrhage; placental abnormalities wth subsequent pregnancies, incisional problems, thromboembolic phenomenon and other postoperative/anesthesia complications. The patient concurred with the proposed plan, giving informed written consent for the procedure.

## 2013-09-27 NOTE — Anesthesia Postprocedure Evaluation (Signed)
Anesthesia Post Note  Patient: Diana Snyder  Procedure(s) Performed: Procedure(s) (LRB): CESAREAN SECTION (N/A)  Anesthesia type: Epidural  Patient location: PACU  Post pain: Pain level controlled  Post assessment: Post-op Vital signs reviewed  Last Vitals:  Filed Vitals:   09/27/13 1415  BP: 136/85  Pulse:   Temp: 36.8 C  Resp:     Post vital signs: Reviewed  Level of consciousness: awake  Complications: No apparent anesthesia complications

## 2013-09-27 NOTE — Transfer of Care (Signed)
Immediate Anesthesia Transfer of Care Note  Patient: Diana PortelaNasticia C Snyder  Procedure(s) Performed: Procedure(s): CESAREAN SECTION (N/A)  Patient Location: PACU  Anesthesia Type:Epidural  Level of Consciousness: awake  Airway & Oxygen Therapy: Patient Spontanous Breathing  Post-op Assessment: Report given to PACU RN  Post vital signs: Reviewed and stable  Complications: No apparent anesthesia complications

## 2013-09-27 NOTE — Op Note (Signed)
Diana PortelaNasticia C Snyder PROCEDURE DATE: 09/25/2013 - 09/27/2013  PREOPERATIVE DIAGNOSES: Intrauterine pregnancy at  5129w2d weeks gestation; failure to progress: arrest of dilation  POSTOPERATIVE DIAGNOSES: The same  PROCEDURE: Primary Low Transverse Cesarean Section  SURGEON:  Dr. Penne LashLeggett  ASSISTANT:  Dr. Ike Benedom  INDICATIONS: Diana Portelaasticia C Minnie is a 18 y.o. G1P1001 at 6029w2d here for cesarean section secondary to the indications listed under preoperative diagnoses; please see preoperative note for further details.  The risks of cesarean section were discussed with the patient including but were not limited to: bleeding which may require transfusion or reoperation; infection which may require antibiotics; injury to bowel, bladder, ureters or other surrounding organs; injury to the fetus; need for additional procedures including hysterectomy in the event of a life-threatening hemorrhage; placental abnormalities wth subsequent pregnancies, incisional problems, thromboembolic phenomenon and other postoperative/anesthesia complications.   The patient concurred with the proposed plan, giving informed written consent for the procedure.    FINDINGS:  Viable female infant in cephalic presentation.  Apgars 8 and 9.  Clear amniotic fluid.  Intact placenta, three vessel cord.  Normal uterus, fallopian tubes and ovaries bilaterally.  ANESTHESIA: Epidural INTRAVENOUS FLUIDS: 1400 ml ESTIMATED BLOOD LOSS: 1000 ml URINE OUTPUT:  550 ml SPECIMENS: Placenta sent to L&D COMPLICATIONS: None immediate  PROCEDURE IN DETAIL:  The patient preoperatively received intravenous antibiotics and had sequential compression devices applied to her lower extremities.  She was then taken to the operating room where spinal anesthesia was the epidural anesthesia was dosed up to surgical level and was found to be adequate. She was then placed in a dorsal supine position with a leftward tilt, and prepped and draped in a sterile manner.  A  foley catheter was already placed into her bladder and attached to constant gravity.  After an adequate timeout was performed, a Pfannenstiel skin incision was made with scalpel and carried through to the underlying layer of fascia. The fascia was incised in the midline, and this incision was extended bilaterally using the Mayo scissors.  Kocher clamps were applied to the superior aspect of the fascial incision and the underlying rectus muscles were dissected off bluntly. A similar process was carried out on the inferior aspect of the fascial incision. The rectus muscles were separated in the midline sharply and the peritoneum was entered bluntly. Jon Gillslexis was placed for visualization. Attention was turned to the lower uterine segment where a low transverse hysterotomy was made with a scalpel and extended bilaterally bluntly.  The infant was successfully delivered, the cord was clamped and cut and the infant was handed over to awaiting neonatology team. Uterine massage was then administered, and the placenta delivered intact with a three-vessel cord. The uterus was then cleared of clot and debris.  The hysterotomy was closed with 0 Vicryl in a running locked fashion, and an imbricating layer was also placed with 0 Vicryl. The pelvis was cleared of all clot and debris. Hemostasis was confirmed on all surfaces.  The fascia was then closed using 0 Vicryl in a running fashion.  The subcutaneous layer was irrigated, then reapproximated with 2-0 plain gut interrupted stitche, and the skin was closed with a 4-0 Vicryl subcuticular stitch. The patient tolerated the procedure well. Sponge, lap, instrument and needle counts were correct x 2.  She was taken to the recovery room in stable condition.   Tawana ScaleMichael Ryan Jalah Warmuth, MD OB Fellow

## 2013-09-27 NOTE — Progress Notes (Signed)
Diana Snyder is a 18 y.o. G1P0 at 6440w2d admitted for induction of labor due to class B dM.  Subjective:  Feeling some pain on left side. Has been pushing with nursing for 30 min with no progress. +FM.    Objective: BP 131/77  Pulse 74  Temp(Src) 98.1 F (36.7 C) (Oral)  Resp 16  Ht 5\' 7"  (1.702 m)  Wt 106.85 kg (235 lb 9 oz)  BMI 36.89 kg/m2  SpO2 100%  LMP 12/26/2012   Total I/O In: -  Out: 1300 [Urine:1300]  FHT:  FHR: 140 bpm, variability: moderate,  accelerations:  Present,  decelerations:  Present variable UC:   Irregular. MVUs 150-170   SVE:   Dilation: 10 Effacement (%): 100 Station: +2 Exam by:: L.Stubbs, RN  Labs: Lab Results  Component Value Date   WBC 8.2 09/26/2013   HGB 11.7* 09/26/2013   HCT 33.6* 09/26/2013   MCV 86.8 09/26/2013   PLT 175 09/26/2013    Assessment / Plan: IOL for class 2 DM. has been progressing per nursing despite inadequate MVUs since 1am and now pushing x 30 min with slow change. On my exam, pt is 5/80/0  Labor: now with unchanged exam from my last check around 1am. has been inadequate on MVUs and not being increased on pitocin due to  exam being called changed.  Now will cont to increase pitocin until adequate MVUs and then re-evaluate after 2 hours of adequate contractions.  Fetal Wellbeing:  Category II Pain Control:  Epidural I/D:  GBS pos and on PCN Anticipated MOD:  NSVD- anticipate currently but should she achieve adequate MVUs and make no change, will need to discuss other options.  However, currently has not been given enough of a chance to maintain adequate ctx for greater than a 20min period.  Diana Snyder 09/27/2013, 6:06 AM

## 2013-09-28 ENCOUNTER — Encounter (HOSPITAL_COMMUNITY): Payer: Self-pay | Admitting: Obstetrics & Gynecology

## 2013-09-28 LAB — CBC
HCT: 29.8 % — ABNORMAL LOW (ref 36.0–46.0)
HEMOGLOBIN: 10.3 g/dL — AB (ref 12.0–15.0)
MCH: 29.9 pg (ref 26.0–34.0)
MCHC: 34.6 g/dL (ref 30.0–36.0)
MCV: 86.6 fL (ref 78.0–100.0)
PLATELETS: 163 10*3/uL (ref 150–400)
RBC: 3.44 MIL/uL — ABNORMAL LOW (ref 3.87–5.11)
RDW: 12.8 % (ref 11.5–15.5)
WBC: 9.8 10*3/uL (ref 4.0–10.5)

## 2013-09-28 LAB — GLUCOSE, CAPILLARY
GLUCOSE-CAPILLARY: 119 mg/dL — AB (ref 70–99)
GLUCOSE-CAPILLARY: 151 mg/dL — AB (ref 70–99)
GLUCOSE-CAPILLARY: 113 mg/dL — AB (ref 70–99)
Glucose-Capillary: 193 mg/dL — ABNORMAL HIGH (ref 70–99)

## 2013-09-28 LAB — BIRTH TISSUE RECOVERY COLLECTION (PLACENTA DONATION)

## 2013-09-28 NOTE — Progress Notes (Signed)
Subjective: Postpartum Day 1: Cesarean Delivery Patient reports tolerating PO and no problems voiding, up ad lib. No flatus or BM yet. Bleeding has backed down. Incision has no significant drainage through dressing. Pain is well controlled. Patient is bottle feeding only and plans on using IUD for MOC. Circumcision will be done as outpatient.  Objective: Vital signs in last 24 hours: Temp:  [97.3 F (36.3 C)-99.5 F (37.5 C)] 97.3 F (36.3 C) (04/21 0500) Pulse Rate:  [65-98] 69 (04/21 0500) Resp:  [13-20] 18 (04/21 0500) BP: (100-152)/(48-99) 103/69 mmHg (04/21 0500) SpO2:  [96 %-100 %] 98 % (04/21 0500)  Physical Exam:  General: alert, cooperative and appears stated age Lochia: appropriate Uterine Fundus: firm Incision: no significant drainage DVT Evaluation: No evidence of DVT seen on physical exam. Negative Homan's sign. No cords or calf tenderness. No significant calf/ankle edema.   Recent Labs  09/26/13 1710 09/28/13 0610  HGB 11.7* 10.3*  HCT 33.6* 29.8*    Assessment/Plan: Status post Cesarean section. Doing well postoperatively.  Cont routine care  Wallis BambergMario Mani 09/28/2013, 7:46 AM  I have seen and examined this patient and agree with above documentation in the PA student's note.   Rulon AbideKeli Trey Bebee, M.D. [facility] 09/28/2013 8:58 AM

## 2013-09-28 NOTE — Anesthesia Postprocedure Evaluation (Signed)
  Anesthesia Post-op Note  Anesthesia Post Note  Patient: Diana PortelaNasticia C Snyder  Procedure(s) Performed: Procedure(s) (LRB): CESAREAN SECTION (N/A)  Anesthesia type: Epidural  Patient location: Mother/Baby  Post pain: Pain level controlled  Post assessment: Post-op Vital signs reviewed  Last Vitals:  Filed Vitals:   09/28/13 0500  BP: 103/69  Pulse: 69  Temp: 36.3 C  Resp: 18    Post vital signs: Reviewed  Level of consciousness:alert  Complications: No apparent anesthesia complications

## 2013-09-28 NOTE — Addendum Note (Signed)
Addendum created 09/28/13 0817 by Turner DanielsJennifer L Josefina Rynders, CRNA   Modules edited: Notes Section   Notes Section:  File: [id number]

## 2013-09-28 NOTE — Progress Notes (Signed)
UR completed 

## 2013-09-29 LAB — GLUCOSE, CAPILLARY
GLUCOSE-CAPILLARY: 109 mg/dL — AB (ref 70–99)
Glucose-Capillary: 113 mg/dL — ABNORMAL HIGH (ref 70–99)

## 2013-09-29 MED ORDER — IBUPROFEN 600 MG PO TABS: 600.0000 mg | ORAL_TABLET | Freq: Four times a day (QID) | ORAL | Status: DC

## 2013-09-29 MED ORDER — METFORMIN HCL 1000 MG PO TABS
1000.0000 mg | ORAL_TABLET | Freq: Two times a day (BID) | ORAL | Status: DC
Start: 2013-09-29 — End: 2013-09-29

## 2013-09-29 MED ORDER — OXYCODONE-ACETAMINOPHEN 5-325 MG PO TABS: 1.0000 | ORAL_TABLET | ORAL | Status: DC | PRN

## 2013-09-29 MED ORDER — DOCUSATE SODIUM 100 MG PO CAPS: 100.0000 mg | ORAL_CAPSULE | Freq: Two times a day (BID) | ORAL | Status: DC

## 2013-09-29 MED ORDER — METFORMIN HCL 1000 MG PO TABS: 1000.0000 mg | ORAL_TABLET | Freq: Two times a day (BID) | ORAL | Status: DC

## 2013-09-29 NOTE — Discharge Instructions (Signed)
Postpartum Care After Cesarean Delivery °After you deliver your newborn (postpartum period), the usual stay in the hospital is 24 72 hours. If there were problems with your labor or delivery, or if you have other medical problems, you might be in the hospital longer.  °While you are in the hospital, you will receive help and instructions on how to care for yourself and your newborn during the postpartum period.  °While you are in the hospital: °· It is normal for you to have pain or discomfort from the incision in your abdomen. Be sure to tell your nurses when you are having pain, where the pain is located, and what makes the pain worse. °· If you are breastfeeding, you may feel uncomfortable contractions of your uterus for a couple of weeks. This is normal. The contractions help your uterus get back to normal size. °· It is normal to have some bleeding after delivery. °· For the first 1 3 days after delivery, the flow is red and the amount may be similar to a period. °· It is common for the flow to start and stop. °· In the first few days, you may pass some small clots. Let your nurses know if you begin to pass large clots or your flow increases. °· Do not  flush blood clots down the toilet before having the nurse look at them. °· During the next 3 10 days after delivery, your flow should become more watery and pink or brown-tinged in color. °· Ten to fourteen days after delivery, your flow should be a small amount of yellowish-white discharge. °· The amount of your flow will decrease over the first few weeks after delivery. Your flow may stop in 6 8 weeks. Most women have had their flow stop by 12 weeks after delivery. °· You should change your sanitary pads frequently. °· Wash your hands thoroughly with soap and water for at least 20 seconds after changing pads, using the toilet, or before holding or feeding your newborn. °· Your intravenous (IV) tubing will be removed when you are drinking enough fluids. °· The  urine drainage tube (urinary catheter) that was inserted before delivery may be removed within 6 8 hours after delivery or when feeling returns to your legs. You should feel like you need to empty your bladder within the first 6 8 hours after the catheter has been removed. °· In case you become weak, lightheaded, or faint, call your nurse before you get out of bed for the first time and before you take a shower for the first time. °· Within the first few days after delivery, your breasts may begin to feel tender and full. This is called engorgement. Breast tenderness usually goes away within 48 72 hours after engorgement occurs. You may also notice milk leaking from your breasts. If you are not breastfeeding, do not stimulate your breasts. Breast stimulation can make your breasts produce more milk. °· Spending as much time as possible with your newborn is very important. During this time, you and your newborn can feel close and get to know each other. Having your newborn stay in your room (rooming in) will help to strengthen the bond with your newborn. It will give you time to get to know your newborn and become comfortable caring for your newborn. °· Your hormones change after delivery. Sometimes the hormone changes can temporarily cause you to feel sad or tearful. These feelings should not last more than a few days. If these feelings last longer   than that, you should talk to your caregiver. °· If desired, talk to your caregiver about methods of family planning or contraception. °· Talk to your caregiver about immunizations. Your caregiver may want you to have the following immunizations before leaving the hospital: °· Tetanus, diphtheria, and pertussis (Tdap) or tetanus and diphtheria (Td) immunization. It is very important that you and your family (including grandparents) or others caring for your newborn are up-to-date with the Tdap or Td immunizations. The Tdap or Td immunization can help protect your newborn  from getting ill. °· Rubella immunization. °· Varicella (chickenpox) immunization. °· Influenza immunization. You should receive this annual immunization if you did not receive the immunization during your pregnancy. °Document Released: 02/19/2012 Document Reviewed: 02/19/2012 °ExitCare® Patient Information ©2014 [employer]. ° °

## 2013-09-29 NOTE — Discharge Summary (Signed)
Obstetric Discharge Summary Reason for Admission: induction of labor and subsequent Cesarean-section Prenatal Procedures: fetal echo Intrapartum Procedures: cesarean: low cervical, transverse Postpartum Procedures: none Complications-Operative and Postpartum: none  Hospital Course: [name] is 18 y.o. G1P1001 1567w2d presenting for IOL due to class B DM. Subsequently, due to growing caput, cervical swelling, and lack of progression of IOL per Dr. Penne LashLeggett a Cesarean-section was performed. A viable baby boy was delivered without complication. Today, the patient reports doing well, tolerating PO and no problems voiding, up ad lib, +BM. Incision has no significant drainage through dressing. Pain is well controlled. Patient is bottle feeding only and plans on using IUD for MOC. Circumcision will be done as outpatient.  Delivery Note At 12:36 PM a viable female was delivered via C-Section, Low Transverse (Presentation: ;  ).  APGAR: 8, 9; weight 7 lb 14.1 oz (3575 g).   Placenta status: Intact, Manual removal.  Cord: 3 vessels with the following complications: None.  Anesthesia: Epidural  Episiotomy: None Lacerations: None Suture Repair: vicryl Est. Blood Loss (mL):   Mom to postpartum.  Baby to Couplet care / Skin to Skin.  Wallis BambergMario Mani 09/29/2013, 7:55 AM     H/H: Lab Results  Component Value Date/Time   HGB 10.3* 09/28/2013  6:10 AM   HCT 29.8* 09/28/2013  6:10 AM    Filed Vitals:   09/29/13 0540  BP: 108/72  Pulse: 71  Temp: 97.5 F (36.4 C)  Resp: 18    Physical Exam: VSS NAD Abd: Appropriately tender, ND, Fundus @U -firm Incision: clear, dry and intact No c/c/e, Neg homan's sign, neg cords Lochia Appropriate  Discharge Diagnoses: Term Pregnancy-delivered via LTCS  Discharge Information: Date: 12/20/2010 Activity: pelvic rest Diet: routine  Medications: None Breast feeding:  No: bottle feeding Condition: stable Instructions: refer to handout Discharge to:  home    Future Appointments Provider Department Dept Phone   10/29/2013 8:30 AM Vale HavenKeli L Beck, MD [facility] [phone]       Medication List    ASK your doctor about these medications       ACCU-CHEK FASTCLIX LANCETS Misc  Lancets for testing 4X daily for GDM 648.83     cephALEXin 500 MG capsule  Commonly known as:  KEFLEX  Take 1 capsule (500 mg total) by mouth 2 (two) times daily.     glucose blood test strip  Use as instructed     glyBURIDE 2.5 MG tablet  Commonly known as:  DIABETA  Take one tablet at bedtime and one tablet with breakfast     metFORMIN 1000 MG tablet  Commonly known as:  GLUCOPHAGE  Take 1 tablet (1,000 mg total) by mouth 2 (two) times daily with a meal.     prenatal vitamin w/FE, FA 27-1 MG Tabs tablet  Take 1 tablet by mouth daily at 12 noon.         Wallis BambergMario Mani 09/29/2013,7:55 AM   I have seen the patient with the resident/student and agree with the above.  Tawnya CrookHeather Donovan Cristine Daw

## 2013-10-12 NOTE — Progress Notes (Signed)
NST reactive on 08/12/13 

## 2013-10-29 ENCOUNTER — Encounter: Payer: Self-pay | Admitting: Family Medicine

## 2013-10-29 ENCOUNTER — Ambulatory Visit (INDEPENDENT_AMBULATORY_CARE_PROVIDER_SITE_OTHER): Payer: Medicaid Other | Admitting: Family Medicine

## 2013-10-29 NOTE — Progress Notes (Signed)
Patient ID: [name], female   DOB: 1996/05/25, 18 y.o.   MRN: [id number] Subjective:    Diana Snyder is a 18 y.o. G76P1001 African American female who presents for a postpartum visit. She is 5 weeks postpartum following a LTCS for failure to progress. I have fully reviewed the prenatal and intrapartum course. The delivery was at 39.2 gestational weeks. Outcome: primary cesarean section, low transverse incision. Anesthesia: epidural. Postpartum course has been uncomplicated. Baby's course has been uncomplicated. Baby is feeding by bottle - gerber goodstart. Bleeding no bleeding. Bowel function is normal. Bladder function is normal. Patient is\ sexually active didn't use protection and last time was several nights ago. Contraception method is IUD. Postpartum depression screening: negative.  The following portions of the patient's history were reviewed and updated as appropriate: allergies, current medications, past medical history, past surgical history and problem list.  Review of Systems Pertinent items are noted in HPI.   Filed Vitals:   10/29/13 0839  BP: 125/84  Pulse: 82  Temp: 98.2 F (36.8 C)  TempSrc: Oral  Height: 5\' 4"  (1.626 m)  Weight: 97.977 kg (216 lb)    Objective:     General:  alert, cooperative and no distress   Breasts:  deferred, no complaints  Lungs: clear to auscultation bilaterally  Heart:  regular rate and rhythm  Abdomen: soft, nontender  SKIN Well healed c/s scar without evidence of wound breakdown                       Assessment:   normal postpartum exam 5 wks s/p LTCS for failure to progress Depression screening Contraception counseling   Plan:   Contraception: wanted IUD but had unprotected sex 2 days ago.  will have her f/u in 2 weeks for UPT and IUD placement Follow up in: 2 weeks for IUD insertion or as needed.

## 2013-10-29 NOTE — Patient Instructions (Signed)
Intrauterine Device Information An intrauterine device (IUD) is inserted into your uterus to prevent pregnancy. There are two types of IUDs available:   Copper IUD This type of IUD is wrapped in copper wire and is placed inside the uterus. Copper makes the uterus and fallopian tubes produce a fluid that kills sperm. The copper IUD can stay in place for 10 years.  Hormone IUD This type of IUD contains the hormone progestin (synthetic progesterone). The hormone thickens the cervical mucus and prevents sperm from entering the uterus. It also thins the uterine lining to prevent implantation of a fertilized egg. The hormone can weaken or kill the sperm that get into the uterus. One type of hormone IUD can stay in place for 5 years, and another type can stay in place for 3 years. Your health care provider will make sure you are a good candidate for a contraceptive IUD. Discuss with your health care provider the possible side effects.  ADVANTAGES OF AN INTRAUTERINE DEVICE  IUDs are highly effective, reversible, long acting, and low maintenance.   There are no estrogen-related side effects.   An IUD can be used when breastfeeding.   IUDs are not associated with weight gain.   The copper IUD works immediately after insertion.   The hormone IUD works right away if inserted within 7 days of your period starting. You will need to use a backup method of birth control for 7 days if the hormone IUD is inserted at any other time in your cycle.  The copper IUD does not interfere with your female hormones.   The hormone IUD can make heavy menstrual periods lighter and decrease cramping.   The hormone IUD can be used for 3 or 5 years.   The copper IUD can be used for 10 years. DISADVANTAGES OF AN INTRAUTERINE DEVICE  The hormone IUD can be associated with irregular bleeding patterns.   The copper IUD can make your menstrual flow heavier and more painful.   You may experience cramping and  vaginal bleeding after insertion.  Document Released: 04/30/2004 Document Revised: 01/27/2013 Document Reviewed: 11/15/2012 Exit[employer] Patient Information 2014 [employer].  

## 2013-11-12 ENCOUNTER — Ambulatory Visit: Payer: Medicaid Other | Admitting: Advanced Practice Midwife

## 2013-12-09 ENCOUNTER — Ambulatory Visit: Payer: Medicaid Other | Admitting: Obstetrics and Gynecology

## 2014-04-11 ENCOUNTER — Encounter: Payer: Self-pay | Admitting: Family Medicine

## 2014-05-11 ENCOUNTER — Encounter: Payer: Self-pay | Admitting: Family Medicine

## 2014-05-12 ENCOUNTER — Encounter: Payer: Self-pay | Admitting: Obstetrics & Gynecology

## 2014-12-07 ENCOUNTER — Encounter: Payer: Self-pay | Admitting: *Deleted

## 2015-06-29 IMAGING — US US OB FOLLOW-UP
1 series · 12 of 28 positions shown · non-contrast
Comparison: none

OBSTETRICS REPORT
                      (Signed Final 08/16/2013 [DATE])

Service(s) Provided
 US OB FOLLOW UP                                       76816.1
Indications
 Diabetes - Pregestational, type 2 on oral meds
Fetal Evaluation
 Num Of Fetuses:    1
 Fetal Heart Rate:  150                          bpm
 Cardiac Activity:  Observed
 Presentation:      Cephalic
 Placenta:          Posterior, above cervical
                    os
 P. Cord            Previously Visualized
 Insertion:
 Amniotic Fluid
 AFI FV:      Subjectively within normal limits
 AFI Sum:     14.09   cm       49  %Tile     Larg Pckt:    4.44  cm
 RUQ:   3.38    cm   RLQ:    4.44   cm    LUQ:   2.42    cm   LLQ:    3.85   cm
Biometry
 BPD:     81.2  mm     G. Age:  32w 4d                CI:        73.98   70 - 86
                                                      FL/HC:      21.3   19.9 -
 HC:     299.8  mm     G. Age:  33w 2d       15  %    HC/AC:      1.04   0.96 -
 AC:     289.1  mm     G. Age:  32w 6d       41  %    FL/BPD:     78.7   71 - 87
 FL:      63.9  mm     G. Age:  33w 0d       31  %    FL/AC:      22.1   20 - 24
 Est. FW:    4081  gm    4 lb 10 oz      51  %
Gestational Age
 LMP:           33w 2d        Date:  12/26/12                 EDD:   10/02/13
 U/S Today:     33w 0d                                        EDD:   10/04/13
 Best:          33w 2d     Det. By:  LMP  (12/26/12)          EDD:   10/02/13
Anatomy
 Cranium:          Appears normal         Aortic Arch:      Appears normal
 Fetal Cavum:      Previously seen        Ductal Arch:      Previously seen
 Ventricles:       Appears normal         Diaphragm:        Previously seen
 Choroid Plexus:   Previously seen        Stomach:          Appears normal, left
                                                            sided
 Cerebellum:       Previously seen        Abdomen:          Previously seen
 Posterior Fossa:  Previously seen        Abdominal Wall:   Previously seen
 Nuchal Fold:      Not applicable (>20    Cord Vessels:     Previously seen
                   wks GA)
 Face:             Orbits and profile     Kidneys:          Appear normal
                   previously seen
 Lips:             Previously seen        Bladder:          Appears normal
 Heart:            Appears normal         Spine:            Previously seen
                   (4CH, axis, and
                   situs)
 RVOT:             Previously seen        Lower             Previously seen
                                          Extremities:
 LVOT:             Appears normal         Upper             Previously seen
 Other:  Fetus appears to be a male. Technically difficult due to fetal position.
Cervix Uterus Adnexa
 Cervix:       Not visualized (advanced GA >67wks)
 Uterus:       No abnormality visualized.
 Cul De Sac:   No free fluid seen.
 Left Ovary:    Not visualized. No adnexal mass visualized.
 Right Ovary:   Not visualized. No adnexal mass visualized.
 Adnexa:     No abnormality visualized.
Impression
INDICATION: 17 yr old G1P0 at 66w7d with type II diabetes for
 fetal growth. Remote read.

[Series 1: us ob follow up · 12 of 43 slices shown]
[im 2/43]
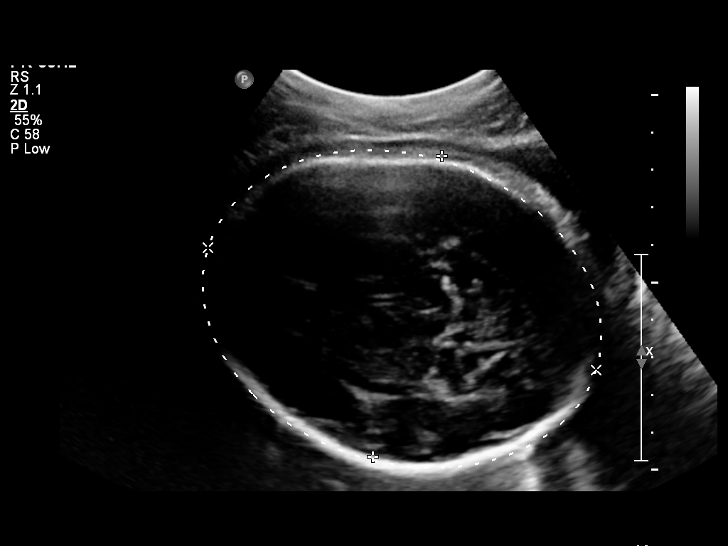
[im 5/43]
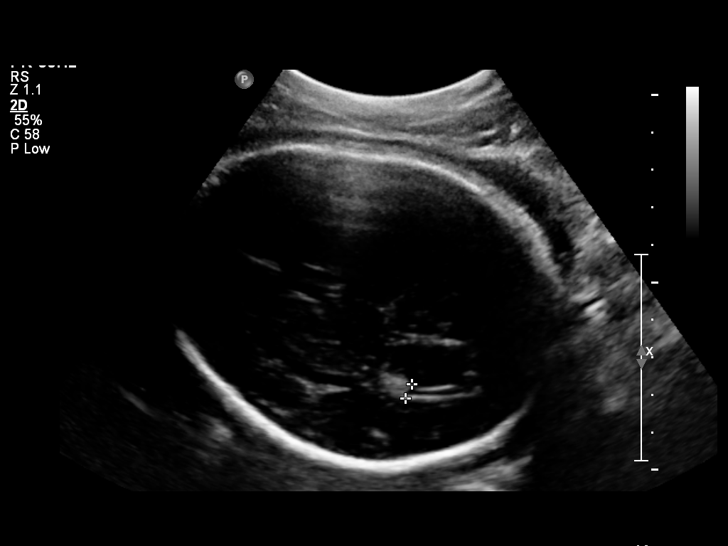
[im 8/43]
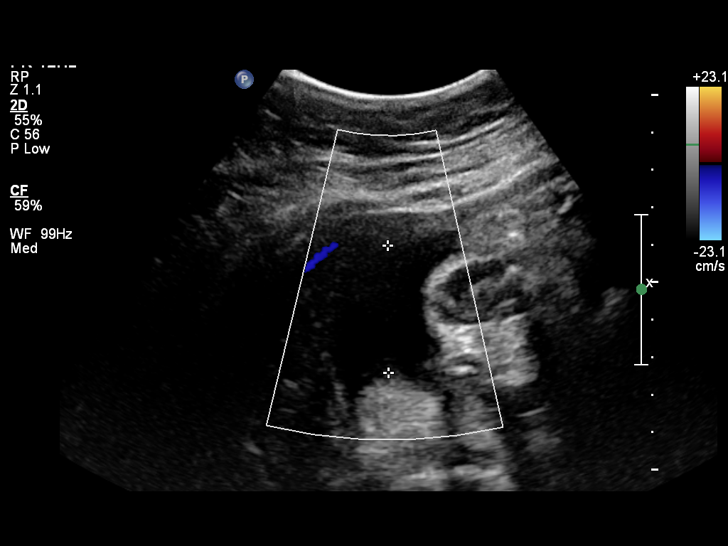
[im 13/43]
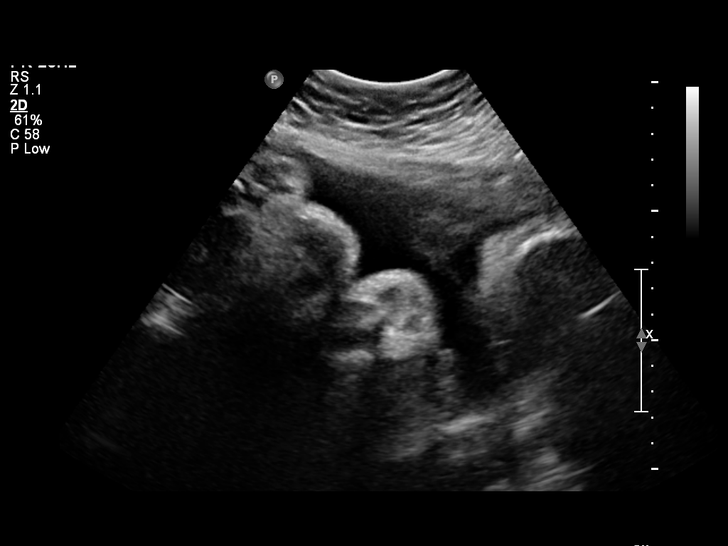
[im 16/43]
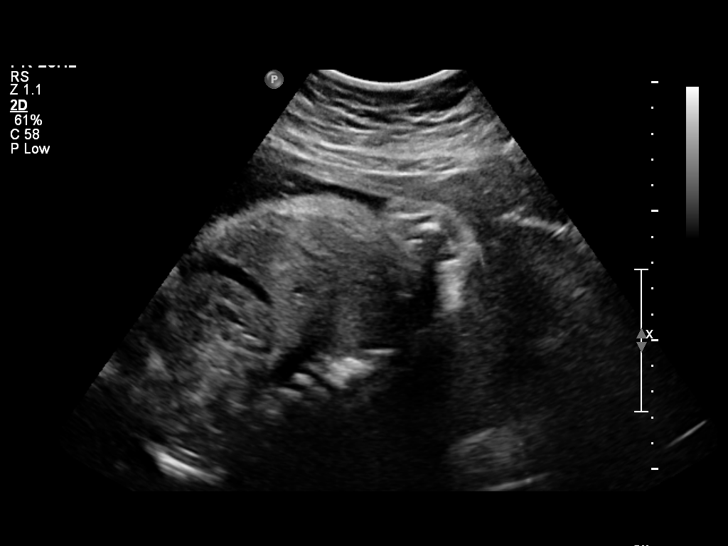
[im 19/43]
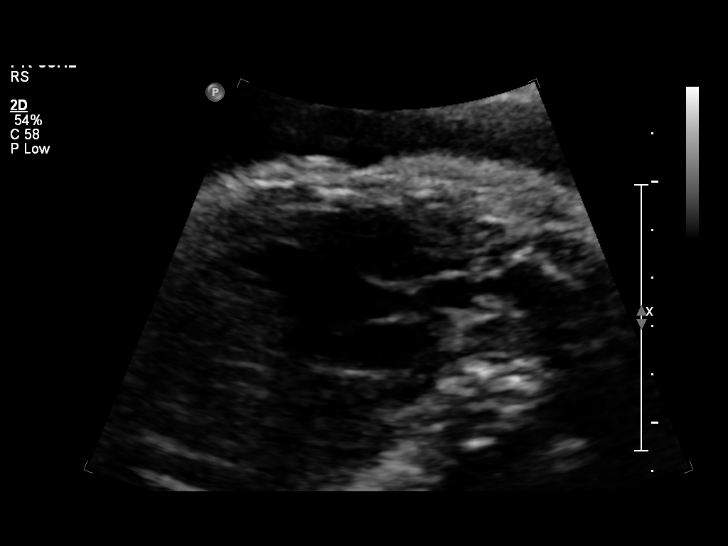
[im 24/43]
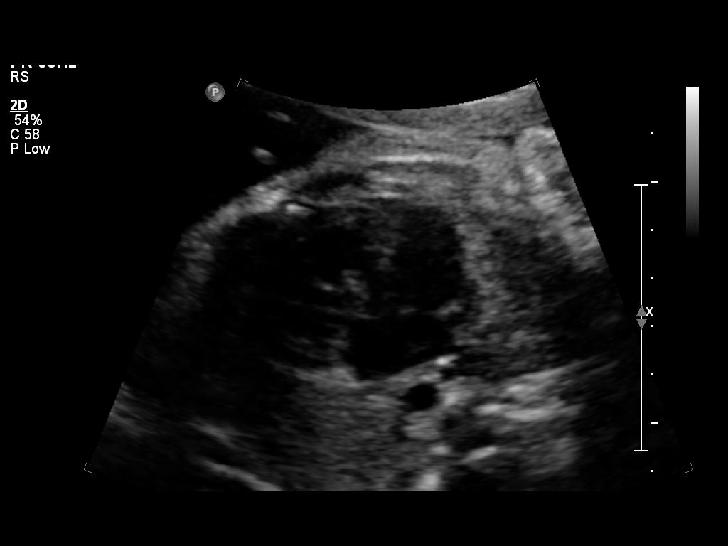
[im 27/43]
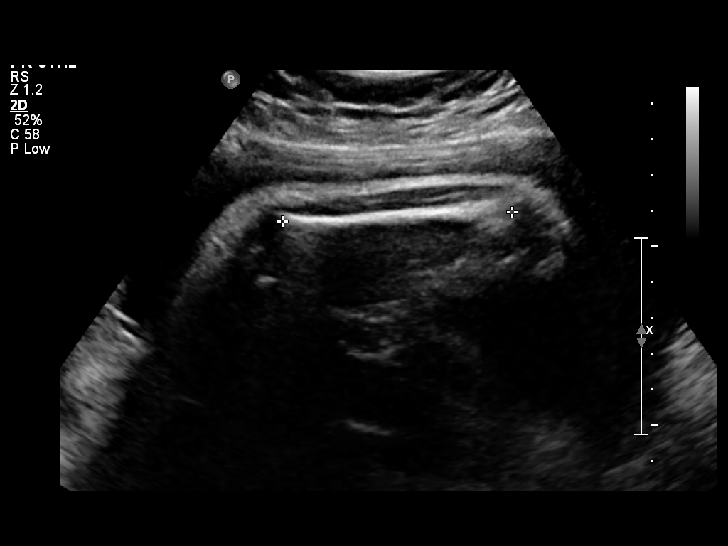
[im 30/43]
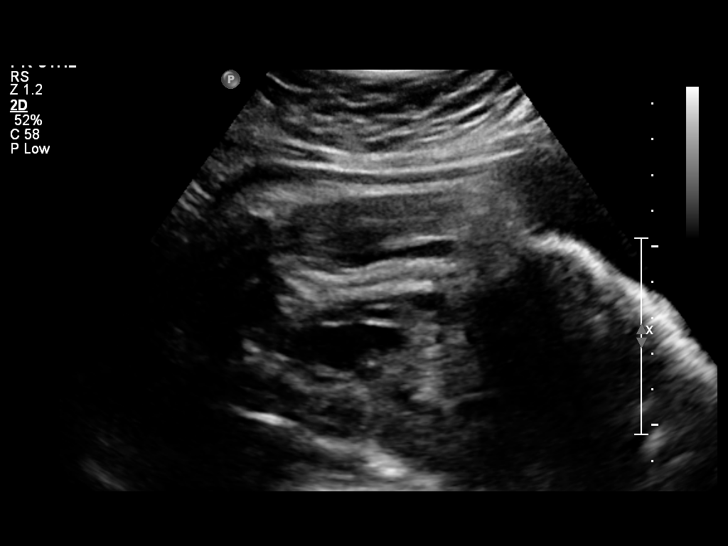
[im 35/43]
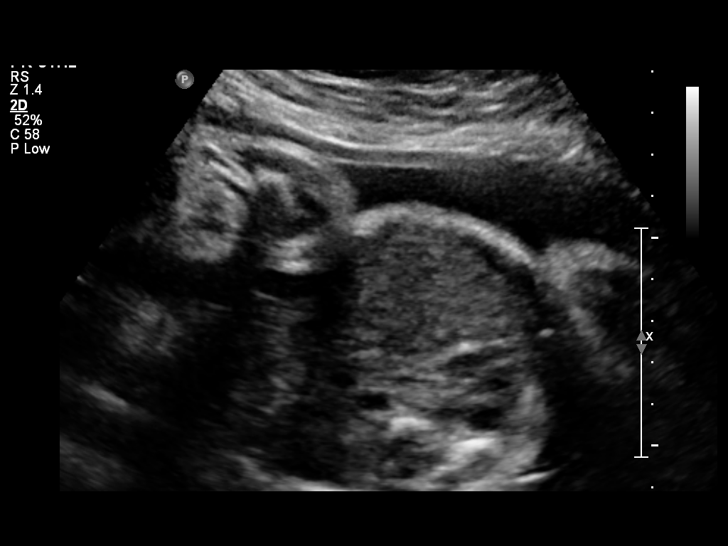
[im 38/43]
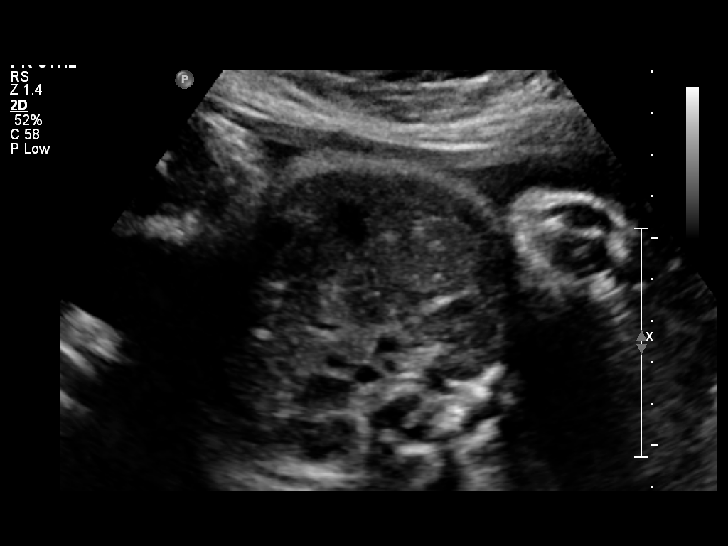
[im 41/43]
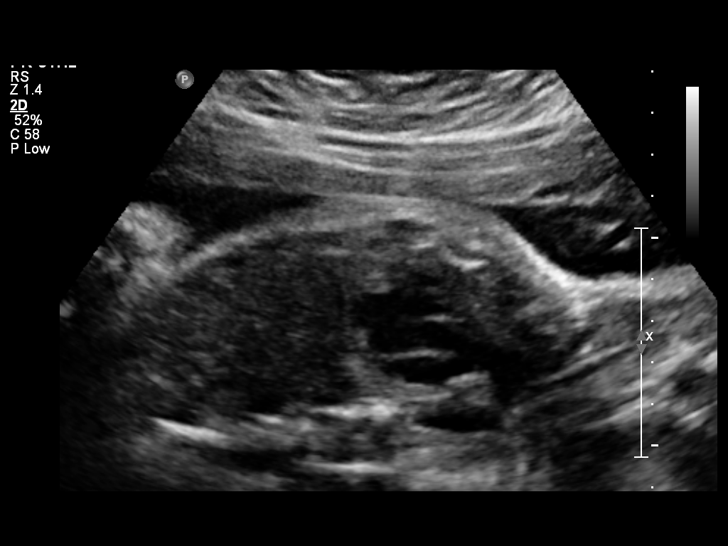

[12 of 28 positions shown; findings below may reference images not displayed]

FINDINGS: 1. Single intrauterine pregnancy.
 2. Estimated fetal weight is in the 51st%.
 3. Posterior placenta without evidence of previa.
 4. Normal amniotic fluid index.
 5. The limited anatomy survey is normal.
Recommendations

 1. Appropriate fetal growth.
 2. Diabetes:
 - normal fetal echocardiogram
 - recommend antenatal testing
 - recommend fetal growth every 4 weeks

## 2017-01-28 ENCOUNTER — Encounter (HOSPITAL_BASED_OUTPATIENT_CLINIC_OR_DEPARTMENT_OTHER): Payer: Self-pay | Admitting: *Deleted

## 2017-01-28 ENCOUNTER — Emergency Department (HOSPITAL_BASED_OUTPATIENT_CLINIC_OR_DEPARTMENT_OTHER)
Admission: EM | Admit: 2017-01-28 | Discharge: 2017-01-28 | Disposition: A | Discharge status: Home / Self Care | Payer: Medicaid Other | Attending: Emergency Medicine | Admitting: Emergency Medicine

## 2017-01-28 DIAGNOSIS — Z79899 Other long term (current) drug therapy: Secondary | ICD-10-CM | POA: Insufficient documentation

## 2017-01-28 DIAGNOSIS — N200 Calculus of kidney: Secondary | ICD-10-CM | POA: Insufficient documentation

## 2017-01-28 DIAGNOSIS — N12 Tubulo-interstitial nephritis, not specified as acute or chronic: Secondary | ICD-10-CM

## 2017-01-28 DIAGNOSIS — E119 Type 2 diabetes mellitus without complications: Secondary | ICD-10-CM | POA: Insufficient documentation

## 2017-01-28 LAB — CBC
HCT: 39.9 % (ref 36.0–46.0)
HEMOGLOBIN: 14.1 g/dL (ref 12.0–15.0)
MCH: 30.6 pg (ref 26.0–34.0)
MCHC: 35.3 g/dL (ref 30.0–36.0)
MCV: 86.6 fL (ref 78.0–100.0)
PLATELETS: 278 10*3/uL (ref 150–400)
RBC: 4.61 MIL/uL (ref 3.87–5.11)
RDW: 11.2 % — ABNORMAL LOW (ref 11.5–15.5)
WBC: 7.1 10*3/uL (ref 4.0–10.5)

## 2017-01-28 LAB — URINALYSIS, ROUTINE W REFLEX MICROSCOPIC
BILIRUBIN URINE: NEGATIVE
Glucose, UA: >=500 mg/dL — AB
KETONES UR: 40 mg/dL — AB
NITRITE: POSITIVE — AB
Specific Gravity, Urine: 1.037 — ABNORMAL HIGH (ref 1.005–1.030)
pH: 6 (ref 5.0–8.0)

## 2017-01-28 LAB — COMPREHENSIVE METABOLIC PANEL
ALK PHOS: 107 U/L (ref 38–126)
ALT: 12 U/L — ABNORMAL LOW (ref 14–54)
AST: 13 U/L — AB (ref 15–41)
Albumin: 4 g/dL (ref 3.5–5.0)
Anion gap: 12 (ref 5–15)
BILIRUBIN TOTAL: 0.7 mg/dL (ref 0.3–1.2)
BUN: 9 mg/dL (ref 6–20)
CALCIUM: 9.6 mg/dL (ref 8.9–10.3)
CHLORIDE: 88 mmol/L — AB (ref 101–111)
CO2: 30 mmol/L (ref 22–32)
Creatinine, Ser: 0.69 mg/dL (ref 0.44–1.00)
GFR calc non Af Amer: >60 mL/min (ref >60)
Glucose, Bld: 360 mg/dL — ABNORMAL HIGH (ref 65–99)
Potassium: 3.8 mmol/L (ref 3.5–5.1)
Sodium: 130 mmol/L — ABNORMAL LOW (ref 135–145)
TOTAL PROTEIN: 8.4 g/dL — AB (ref 6.5–8.1)

## 2017-01-28 LAB — URINALYSIS, MICROSCOPIC (REFLEX)

## 2017-01-28 LAB — LIPASE, BLOOD: LIPASE: 23 U/L (ref 11–51)

## 2017-01-28 LAB — PREGNANCY, URINE: PREG TEST UR: NEGATIVE

## 2017-01-28 MED ORDER — ONDANSETRON 4 MG PO TBDP
4.0000 mg | ORAL_TABLET | Freq: Once | ORAL | Status: AC
  Administered 2017-01-28: 4 mg via ORAL
  Filled 2017-01-28: qty 1

## 2017-01-28 MED ORDER — CEPHALEXIN 250 MG PO CAPS
500.0000 mg | ORAL_CAPSULE | Freq: Once | ORAL | Status: AC
  Administered 2017-01-28: 500 mg via ORAL
  Filled 2017-01-28: qty 2

## 2017-01-28 MED ORDER — CEPHALEXIN 500 MG PO CAPS: 500.0000 mg | ORAL_CAPSULE | Freq: Three times a day (TID) | ORAL | 0 refills | Status: AC

## 2017-01-28 MED ORDER — ONDANSETRON 4 MG PO TBDP: 4.0000 mg | ORAL_TABLET | Freq: Three times a day (TID) | ORAL | 0 refills | Status: AC | PRN

## 2017-01-28 NOTE — ED Provider Notes (Signed)
[facility] Provider Note   CSN: [id number] Arrival date & time: 01/28/17  1656     History   Chief Complaint Chief Complaint  Patient presents with  . Abdominal Pain    HPI Diana Snyder is a 21 y.o. female.  The history is provided by the patient.  Abdominal Pain   This is a recurrent (similar to prior UTI) problem. Episode onset: 1 week. The problem occurs constantly. The problem has been gradually worsening. The pain is associated with an unknown factor. The pain is located in the suprapubic region (and left flank). The quality of the pain is aching and cramping. The pain is mild. Associated symptoms include diarrhea, nausea, dysuria and frequency. Pertinent negatives include fever and vomiting. Nothing aggravates the symptoms. The symptoms are relieved by NSAIDs (motrin).    Past Medical History:  Diagnosis Date  . Diabetes mellitus without complication [facility])     Patient Active Problem List   Diagnosis Date Noted  . Gestational diabetes mellitus, class B1 09/25/2013  . Preexisting Class B diabetes complicating pregnancy, antepartum 05/31/2013  . Pyelonephritis complicating pregnancy, antepartum 05/31/2013    Past Surgical History:  Procedure Laterality Date  . CESAREAN SECTION N/A 09/27/2013   Procedure: CESAREAN SECTION;  Surgeon: Lesly Dukes, MD;  Location: [facility];  Service: Obstetrics;  Laterality: N/A;    OB History    Gravida Para Term Preterm AB Living   1 1 1     1    SAB TAB Ectopic Multiple Live Births           1       Home Medications    Prior to Admission medications   Medication Sig Start Date End Date Taking? Authorizing Provider  sitaGLIPtin-metformin (JANUMET) 50-1000 MG tablet Take 1 tablet by mouth 2 (two) times daily with a meal.   Yes [provider]  cephALEXin (KEFLEX) 500 MG capsule Take 1 capsule (500 mg total) by mouth 3 (three) times daily. 01/28/17 02/11/17  Nira Conn, MD  ondansetron  (ZOFRAN ODT) 4 MG disintegrating tablet Take 1 tablet (4 mg total) by mouth every 8 (eight) hours as needed for nausea or vomiting. 01/28/17 01/31/17  Lilygrace Rodick, Amadeo Garnet, MD    Family History Family History  Problem Relation Age of Onset  . Diabetes Mother   . Hypertension Mother   . Hypertension Father   . Hypertension Maternal Grandfather     Social History Social History  Substance Use Topics  . Smoking status: Never Smoker  . Smokeless tobacco: Never Used  . Alcohol use No     Allergies   Patient has no known allergies.   Review of Systems Review of Systems  Constitutional: Negative for fever.  Gastrointestinal: Positive for abdominal pain, diarrhea and nausea. Negative for vomiting.  Genitourinary: Positive for dysuria and frequency. Negative for vaginal bleeding and vaginal discharge.     Physical Exam Updated Vital Signs BP (!) 130/95 (BP Location: Left Arm)   Pulse (!) 106   Temp (!) 100.5 F (38.1 C) (Oral) Comment: RN Katie informed  Resp 18   Ht 5\' 6"  (1.676 m)   Wt 109.3 kg (241 lb)   SpO2 99%   BMI 38.90 kg/m   Physical Exam  Constitutional: She is oriented to person, place, and time. She appears well-developed and well-nourished. No distress.  HENT:  Head: Normocephalic and atraumatic.  Nose: Nose normal.  Eyes: Pupils are equal, round, and reactive to light. Conjunctivae and  EOM are normal. Right eye exhibits no discharge. Left eye exhibits no discharge. No scleral icterus.  Neck: Normal range of motion. Neck supple.  Cardiovascular: Normal rate and regular rhythm.  Exam reveals no gallop and no friction rub.   No murmur heard. Pulmonary/Chest: Effort normal and breath sounds normal. No stridor. No respiratory distress. She has no rales.  Abdominal: Soft. She exhibits no distension. There is tenderness (mild discomfort) in the suprapubic area. There is no rigidity, no rebound and no guarding.  Musculoskeletal: She exhibits no edema or  tenderness.  Neurological: She is alert and oriented to person, place, and time.  Skin: Skin is warm and dry. No rash noted. She is not diaphoretic. No erythema.  Psychiatric: She has a normal mood and affect.  Vitals reviewed.    ED Treatments / Results  Labs (all labs ordered are listed, but only abnormal results are displayed) Labs Reviewed  COMPREHENSIVE METABOLIC PANEL - Abnormal; Notable for the following:       Result Value   Sodium 130 (*)    Chloride 88 (*)    Glucose, Bld 360 (*)    Total Protein 8.4 (*)    AST 13 (*)    ALT 12 (*)    All other components within normal limits  CBC - Abnormal; Notable for the following:    RDW 11.2 (*)    All other components within normal limits  URINALYSIS, ROUTINE W REFLEX MICROSCOPIC - Abnormal; Notable for the following:    APPearance TURBID (*)    Specific Gravity, Urine 1.037 (*)    Glucose, UA >=500 (*)    Hgb urine dipstick TRACE (*)    Ketones, ur 40 (*)    Protein, ur >300 (*)    Nitrite POSITIVE (*)    Leukocytes, UA SMALL (*)    All other components within normal limits  URINALYSIS, MICROSCOPIC (REFLEX) - Abnormal; Notable for the following:    Bacteria, UA MANY (*)    Squamous Epithelial / LPF 0-5 (*)    All other components within normal limits  LIPASE, BLOOD  PREGNANCY, URINE    EKG  EKG Interpretation None       Radiology No results found.  Procedures Procedures (including critical care time)  Medications Ordered in ED Medications  cephALEXin (KEFLEX) capsule 500 mg (500 mg Oral Given 01/28/17 1851)  ondansetron (ZOFRAN-ODT) disintegrating tablet 4 mg (4 mg Oral Given 01/28/17 1851)     Initial Impression / Assessment and Plan / ED Course  I have reviewed the triage vital signs and the nursing notes.  Pertinent labs & imaging results that were available during my care of the patient were reviewed by me and considered in my medical decision making (see chart for details).     Presentation  consistent with urinary tract infection with likely pyelonephritis given the left flank pain. Patient is well-hydrated and nontoxic appearing. Able to tolerate oral intake.  Feel she is appropriate for outpatient management.    Final Clinical Impressions(s) / ED Diagnoses   Final diagnoses:  Pyelonephritis   Disposition: Discharge  Condition: Good  I have discussed the results, Dx and Tx plan with the patient who expressed understanding and agree(s) with the plan. Discharge instructions discussed at great length. The patient was given strict return precautions who verbalized understanding of the instructions. No further questions at time of discharge.    New Prescriptions   CEPHALEXIN (KEFLEX) 500 MG CAPSULE    Take 1 capsule (500 mg  total) by mouth 3 (three) times daily.   ONDANSETRON (ZOFRAN ODT) 4 MG DISINTEGRATING TABLET    Take 1 tablet (4 mg total) by mouth every 8 (eight) hours as needed for nausea or vomiting.    Follow Up: Primary care provider  Schedule an appointment as soon as possible for a visit  As needed      Clevester Helzer, Amadeo Garnet, MD 01/28/17 1946

## 2017-01-28 NOTE — ED Triage Notes (Signed)
Pt reports bilateral lower abdomen pressure with intermittent nausea and night sweats. Denies vaginal discharge. Denies vomiting and diarrhea.

## 2017-12-10 ENCOUNTER — Emergency Department (HOSPITAL_BASED_OUTPATIENT_CLINIC_OR_DEPARTMENT_OTHER): Payer: Self-pay

## 2017-12-10 ENCOUNTER — Encounter (HOSPITAL_BASED_OUTPATIENT_CLINIC_OR_DEPARTMENT_OTHER): Payer: Self-pay

## 2017-12-10 ENCOUNTER — Other Ambulatory Visit: Payer: Self-pay

## 2017-12-10 ENCOUNTER — Emergency Department (HOSPITAL_BASED_OUTPATIENT_CLINIC_OR_DEPARTMENT_OTHER)
Admission: EM | Admit: 2017-12-10 | Discharge: 2017-12-11 | Disposition: A | Discharge status: Home / Self Care | Payer: Self-pay | Attending: Emergency Medicine | Admitting: Emergency Medicine

## 2017-12-10 DIAGNOSIS — R1084 Generalized abdominal pain: Secondary | ICD-10-CM

## 2017-12-10 DIAGNOSIS — E119 Type 2 diabetes mellitus without complications: Secondary | ICD-10-CM | POA: Insufficient documentation

## 2017-12-10 DIAGNOSIS — R509 Fever, unspecified: Secondary | ICD-10-CM

## 2017-12-10 DIAGNOSIS — R110 Nausea: Secondary | ICD-10-CM

## 2017-12-10 LAB — URINALYSIS, ROUTINE W REFLEX MICROSCOPIC
BILIRUBIN URINE: NEGATIVE
HGB URINE DIPSTICK: NEGATIVE
Ketones, ur: 15 mg/dL — AB
Leukocytes, UA: NEGATIVE
Nitrite: NEGATIVE
PROTEIN: NEGATIVE mg/dL
Specific Gravity, Urine: <1.005 — ABNORMAL LOW (ref 1.005–1.030)
pH: 7 (ref 5.0–8.0)

## 2017-12-10 LAB — URINALYSIS, MICROSCOPIC (REFLEX): WBC, UA: NONE SEEN WBC/hpf (ref 0–5)

## 2017-12-10 LAB — CBC WITH DIFFERENTIAL/PLATELET
Basophils Absolute: 0 10*3/uL (ref 0.0–0.1)
Basophils Relative: 0 %
EOS PCT: 0 %
Eosinophils Absolute: 0 10*3/uL (ref 0.0–0.7)
HEMATOCRIT: 40.7 % (ref 36.0–46.0)
Hemoglobin: 14.8 g/dL (ref 12.0–15.0)
LYMPHS PCT: 26 %
Lymphs Abs: 1 10*3/uL (ref 0.7–4.0)
MCH: 31.8 pg (ref 26.0–34.0)
MCHC: 36.4 g/dL — AB (ref 30.0–36.0)
MCV: 87.3 fL (ref 78.0–100.0)
MONOS PCT: 12 %
Monocytes Absolute: 0.4 10*3/uL (ref 0.1–1.0)
NEUTROS ABS: 2.2 10*3/uL (ref 1.7–7.7)
Neutrophils Relative %: 62 %
PLATELETS: 194 10*3/uL (ref 150–400)
RBC: 4.66 MIL/uL (ref 3.87–5.11)
RDW: 11.2 % — ABNORMAL LOW (ref 11.5–15.5)
WBC: 3.6 10*3/uL — ABNORMAL LOW (ref 4.0–10.5)

## 2017-12-10 LAB — PREGNANCY, URINE: PREG TEST UR: NEGATIVE

## 2017-12-10 MED ORDER — SODIUM CHLORIDE 0.9 % IV BOLUS
1000.0000 mL | Freq: Once | INTRAVENOUS | Status: AC
  Administered 2017-12-10: 1000 mL via INTRAVENOUS

## 2017-12-10 MED ORDER — KETOROLAC TROMETHAMINE 30 MG/ML IJ SOLN
30.0000 mg | Freq: Once | INTRAMUSCULAR | Status: AC
  Administered 2017-12-10: 30 mg via INTRAVENOUS
  Filled 2017-12-10: qty 1

## 2017-12-10 NOTE — ED Provider Notes (Signed)
Emergency Department Provider Note   I have reviewed the triage vital signs and the nursing notes.   HISTORY  Chief Complaint Abdominal Pain   HPI Diana Snyder is a 22 y.o. female with PMH of DM resents to the emergency department for evaluation of lower abdominal and back pain worsening over the past 2 days.  She has not had associated vomiting or diarrhea.  He denies any dysuria, hesitancy, urgency.  She has noticed fevers at home and has experienced some constipation.  She denies any rectal pain.  No other modifying factors for her symptoms.  No sick contacts or recent travel.  Only past surgical history is prior cesarean sections. Patient further denies any vaginal bleeding or discharge.    Past Medical History:  Diagnosis Date  . Diabetes mellitus without complication [facility](HCC)     Patient Active Problem List   Diagnosis Date Noted  . Gestational diabetes mellitus, class B1 09/25/2013  . Preexisting Class B diabetes complicating pregnancy, antepartum 05/31/2013  . Pyelonephritis complicating pregnancy, antepartum 05/31/2013    Past Surgical History:  Procedure Laterality Date  . CESAREAN SECTION N/A 09/27/2013   Procedure: CESAREAN SECTION;  Surgeon: Lesly DukesKelly H Leggett, MD;  Location: [facility];  Service: Obstetrics;  Laterality: N/A;    Allergies Patient has no known allergies.  Family History  Problem Relation Age of Onset  . Diabetes Mother   . Hypertension Mother   . Hypertension Father   . Hypertension Maternal Grandfather     Social History Social History   Tobacco Use  . Smoking status: Never Smoker  . Smokeless tobacco: Never Used  Substance Use Topics  . Alcohol use: No  . Drug use: No    Review of Systems  Constitutional: No fever/chills Eyes: No visual changes. ENT: No sore throat. Cardiovascular: Denies chest pain. Respiratory: Denies shortness of breath. Gastrointestinal: Positive lower abdominal pain.  No nausea, no vomiting.  No  diarrhea. Positive constipation. Genitourinary: Negative for dysuria. Musculoskeletal: Negative for back pain. Skin: Negative for rash. Neurological: Negative for headaches, focal weakness or numbness.  10-point ROS otherwise negative.  ____________________________________________   PHYSICAL EXAM:  VITAL SIGNS: ED Triage Vitals  Enc Vitals Group     BP 12/10/17 2224 (!) 132/95     Pulse Rate 12/10/17 2224 (!) 104     Resp 12/10/17 2224 16     Temp 12/10/17 2224 (!) 100.4 F (38 C)     Temp Source 12/10/17 2224 Oral     SpO2 12/10/17 2224 98 %     Weight 12/10/17 2224 245 lb (111.1 kg)     Height 12/10/17 2224 5\' 6"  (1.676 m)     Pain Score 12/10/17 2223 5   Constitutional: Alert and oriented. Well appearing and in no acute distress. Eyes: Conjunctivae are normal.  Head: Atraumatic. Nose: No congestion/rhinnorhea. Mouth/Throat: Mucous membranes are moist.  Oropharynx non-erythematous. Neck: No stridor.   Cardiovascular: Tachycardia. Good peripheral circulation. Grossly normal heart sounds.   Respiratory: Normal respiratory effort.  No retractions. Lungs CTAB. Gastrointestinal: Soft with moderate diffuse tenderness to palpation. No rebound or guarding. No distention.  Musculoskeletal: No lower extremity tenderness nor edema. No gross deformities of extremities. Neurologic:  Normal speech and language. No gross focal neurologic deficits are appreciated.  Skin:  Skin is warm, dry and intact. No rash noted.   ____________________________________________   LABS (all labs ordered are listed, but only abnormal results are displayed)  Labs Reviewed  URINALYSIS, ROUTINE W REFLEX MICROSCOPIC -  Abnormal; Notable for the following components:      Result Value   Specific Gravity, Urine <1.005 (*)    Glucose, UA >=500 (*)    Ketones, ur 15 (*)    All other components within normal limits  URINALYSIS, MICROSCOPIC (REFLEX) - Abnormal; Notable for the following components:    Bacteria, UA RARE (*)    All other components within normal limits  COMPREHENSIVE METABOLIC PANEL - Abnormal; Notable for the following components:   Sodium 131 (*)    Chloride 94 (*)    Glucose, Bld 383 (*)    Calcium 8.8 (*)    All other components within normal limits  CBC WITH DIFFERENTIAL/PLATELET - Abnormal; Notable for the following components:   WBC 3.6 (*)    MCHC 36.4 (*)    RDW 11.2 (*)    All other components within normal limits  PREGNANCY, URINE  LIPASE, BLOOD   ____________________________________________  RADIOLOGY  Ct Abdomen Pelvis Wo Contrast  Result Date: 12/11/2017 CLINICAL DATA:  Low abdominal pain and low back pain for 3 days. Fever. Normal white cell count. EXAM: CT ABDOMEN AND PELVIS WITHOUT CONTRAST TECHNIQUE: Multidetector CT imaging of the abdomen and pelvis was performed following the standard protocol without IV contrast. COMPARISON:  None. FINDINGS: Lower chest: The lung bases are clear. Hepatobiliary: No focal liver abnormality is seen. No gallstones, gallbladder wall thickening, or biliary dilatation. Pancreas: Unremarkable. No pancreatic ductal dilatation or surrounding inflammatory changes. Spleen: Normal in size without focal abnormality. Adrenals/Urinary Tract: Adrenal glands are unremarkable. Kidneys are normal, without renal calculi, focal lesion, or hydronephrosis. Bladder is unremarkable. Stomach/Bowel: Stomach is within normal limits. Appendix appears normal. No evidence of bowel wall thickening, distention, or inflammatory changes. Vascular/Lymphatic: No significant vascular findings are present. No enlarged abdominal or pelvic lymph nodes. Reproductive: Uterus and ovaries are not enlarged. An intrauterine device is present with position appearing appropriate. Other: No abdominal wall hernia or abnormality. No abdominopelvic ascites. Musculoskeletal: No acute or significant osseous findings. IMPRESSION: No acute process demonstrated in the abdomen or  pelvis. No renal or ureteral stone or obstruction. No evidence of bowel obstruction or inflammation. An intrauterine device is present. Electronically Signed   By: Burman Nieves M.D.   On: 12/11/2017 00:50    ____________________________________________   PROCEDURES  Procedure(s) performed:   Procedures  None ____________________________________________   INITIAL IMPRESSION / ASSESSMENT AND PLAN / ED COURSE  Pertinent labs & imaging results that were available during my care of the patient were reviewed by me and considered in my medical decision making (see chart for details).  Department for evaluation of lower abdominal and back pain with associated fever here.  UA is normal.  Denies any vaginal bleeding or discharge symptoms.  She does have diffuse moderate tenderness on exam.  This may represent a viral infection but plan for CT imaging of the abdomen and pelvis given fever and diffuse tenderness on exam.   Labs and CT reviewed with no acute findings. Patient w/o vaginal symptoms. No sore throat. No clear fever source. Possible developing. No concern for sepsis or other serious bacterial infection. Plan for supportive care at home and close PCP follow up. Provided contact information at discharge for PCP. Patient to take OTC Tylenol/Motrin for fever.  At this time, I do not feel there is any life-threatening condition present. I have reviewed and discussed all results (EKG, imaging, lab, urine as appropriate), exam findings with patient. I have reviewed nursing notes and appropriate previous records.  I feel the patient is safe to be discharged home without further emergent workup. Discussed usual and customary return precautions. Patient and family (if present) verbalize understanding and are comfortable with this plan.  Patient will follow-up with their primary care provider. If they do not have a primary care provider, information for follow-up has been provided to them. All  questions have been answered.  ____________________________________________  FINAL CLINICAL IMPRESSION(S) / ED DIAGNOSES  Final diagnoses:  Generalized abdominal pain  Fever, unspecified fever cause  Nausea     MEDICATIONS GIVEN DURING THIS VISIT:  Medications  sodium chloride 0.9 % bolus 1,000 mL (0 mLs Intravenous Stopped 12/11/17 0135)  ketorolac (TORADOL) 30 MG/ML injection 30 mg (30 mg Intravenous Given 12/10/17 2340)     NEW OUTPATIENT MEDICATIONS STARTED DURING THIS VISIT:  Discharge Medication List as of 12/11/2017  1:31 AM    START taking these medications   Details  dicyclomine (BENTYL) 20 MG tablet Take 1 tablet (20 mg total) by mouth 2 (two) times daily., Starting Thu 12/11/2017, Print    ondansetron (ZOFRAN ODT) 4 MG disintegrating tablet Take 1 tablet (4 mg total) by mouth every 8 (eight) hours as needed for nausea or vomiting., Starting Thu 12/11/2017, Print        Note:  This document was prepared using Dragon voice recognition software and may include unintentional dictation errors.  Alona Bene, MD Emergency Medicine    Emberleigh Reily, Arlyss Repress, MD 12/11/17 [phone]

## 2017-12-10 NOTE — ED Triage Notes (Signed)
C/o lower abd pain, lower back pain x 203 days-denies v/d, urinary sx and vaginal d/c-NAD-steady gait

## 2017-12-11 LAB — COMPREHENSIVE METABOLIC PANEL
ALBUMIN: 3.7 g/dL (ref 3.5–5.0)
ALT: 23 U/L (ref 0–44)
AST: 19 U/L (ref 15–41)
Alkaline Phosphatase: 92 U/L (ref 38–126)
Anion gap: 10 (ref 5–15)
BILIRUBIN TOTAL: 0.4 mg/dL (ref 0.3–1.2)
BUN: 10 mg/dL (ref 6–20)
CHLORIDE: 94 mmol/L — AB (ref 98–111)
CO2: 27 mmol/L (ref 22–32)
CREATININE: 0.66 mg/dL (ref 0.44–1.00)
Calcium: 8.8 mg/dL — ABNORMAL LOW (ref 8.9–10.3)
GFR calc Af Amer: >60 mL/min (ref >60)
GFR calc non Af Amer: >60 mL/min (ref >60)
GLUCOSE: 383 mg/dL — AB (ref 70–99)
POTASSIUM: 3.9 mmol/L (ref 3.5–5.1)
Sodium: 131 mmol/L — ABNORMAL LOW (ref 135–145)
Total Protein: 7.2 g/dL (ref 6.5–8.1)

## 2017-12-11 LAB — LIPASE, BLOOD: Lipase: 26 U/L (ref 11–51)

## 2017-12-11 MED ORDER — ONDANSETRON 4 MG PO TBDP: 4.0000 mg | ORAL_TABLET | Freq: Three times a day (TID) | ORAL | 0 refills | Status: DC | PRN

## 2017-12-11 MED ORDER — DICYCLOMINE HCL 20 MG PO TABS: 20.0000 mg | ORAL_TABLET | Freq: Two times a day (BID) | ORAL | 0 refills | Status: AC

## 2017-12-11 NOTE — Discharge Instructions (Signed)

## 2017-12-11 NOTE — ED Notes (Signed)
Patient transported to CT 

## 2018-06-17 ENCOUNTER — Encounter (HOSPITAL_BASED_OUTPATIENT_CLINIC_OR_DEPARTMENT_OTHER): Payer: Self-pay | Admitting: *Deleted

## 2018-06-17 ENCOUNTER — Other Ambulatory Visit: Payer: Self-pay

## 2018-06-17 ENCOUNTER — Emergency Department (HOSPITAL_BASED_OUTPATIENT_CLINIC_OR_DEPARTMENT_OTHER)
Admission: EM | Admit: 2018-06-17 | Discharge: 2018-06-18 | Disposition: A | Discharge status: Home / Self Care | Payer: Medicaid Other | Attending: Emergency Medicine | Admitting: Emergency Medicine

## 2018-06-17 DIAGNOSIS — Z79899 Other long term (current) drug therapy: Secondary | ICD-10-CM | POA: Insufficient documentation

## 2018-06-17 DIAGNOSIS — R112 Nausea with vomiting, unspecified: Secondary | ICD-10-CM

## 2018-06-17 DIAGNOSIS — N390 Urinary tract infection, site not specified: Secondary | ICD-10-CM | POA: Insufficient documentation

## 2018-06-17 DIAGNOSIS — E1165 Type 2 diabetes mellitus with hyperglycemia: Secondary | ICD-10-CM | POA: Insufficient documentation

## 2018-06-17 DIAGNOSIS — R739 Hyperglycemia, unspecified: Secondary | ICD-10-CM

## 2018-06-17 LAB — CBC WITH DIFFERENTIAL/PLATELET
Abs Immature Granulocytes: 0.02 10*3/uL (ref 0.00–0.07)
BASOS PCT: 0 %
Basophils Absolute: 0 10*3/uL (ref 0.0–0.1)
Eosinophils Absolute: 0 10*3/uL (ref 0.0–0.5)
Eosinophils Relative: 0 %
HCT: 43.4 % (ref 36.0–46.0)
Hemoglobin: 14.8 g/dL (ref 12.0–15.0)
IMMATURE GRANULOCYTES: 0 %
Lymphocytes Relative: 19 %
Lymphs Abs: 1.2 10*3/uL (ref 0.7–4.0)
MCH: 31 pg (ref 26.0–34.0)
MCHC: 34.1 g/dL (ref 30.0–36.0)
MCV: 91 fL (ref 80.0–100.0)
Monocytes Absolute: 0.4 10*3/uL (ref 0.1–1.0)
Monocytes Relative: 7 %
NEUTROS ABS: 4.6 10*3/uL (ref 1.7–7.7)
Neutrophils Relative %: 74 %
Platelets: 274 10*3/uL (ref 150–400)
RBC: 4.77 MIL/uL (ref 3.87–5.11)
RDW: 11 % — ABNORMAL LOW (ref 11.5–15.5)
WBC: 6.2 10*3/uL (ref 4.0–10.5)
nRBC: 0 % (ref 0.0–0.2)

## 2018-06-17 LAB — PREGNANCY, URINE: PREG TEST UR: NEGATIVE

## 2018-06-17 MED ORDER — SODIUM CHLORIDE 0.9 % IV BOLUS
1000.0000 mL | Freq: Once | INTRAVENOUS | Status: AC
  Administered 2018-06-17: 1000 mL via INTRAVENOUS

## 2018-06-17 MED ORDER — ONDANSETRON HCL 4 MG/2ML IJ SOLN
4.0000 mg | Freq: Once | INTRAMUSCULAR | Status: AC
  Administered 2018-06-17: 4 mg via INTRAVENOUS
  Filled 2018-06-17: qty 2

## 2018-06-17 NOTE — ED Provider Notes (Signed)
[facility] Provider Note: Diana Dell, MD, FACEP  CSN: [id number] MRN: [id number] ARRIVAL: 06/17/18 at 2229 ROOM: MH09/MH09   CHIEF COMPLAINT  Vomiting   HISTORY OF PRESENT ILLNESS  06/17/18 11:25 PM Diana Snyder is a 23 y.o. female who had an episode of diarrhea this morning which was followed by the onset of nausea and vomiting.  She has had 2 episodes of vomiting today.  She states when she drinks water it causes her to vomit but she has been able to keep Gatorade down.  She has had associated periumbilical pain which she rates as a 5 out of 10.  When asked if the pain was sharp, dull or crampy she said "all of those".  She was noted to have a low-grade fever on arrival.   Past Medical History:  Diagnosis Date  . Diabetes mellitus without complication [facility])     Past Surgical History:  Procedure Laterality Date  . CESAREAN SECTION N/A 09/27/2013   Procedure: CESAREAN SECTION;  Surgeon: Lesly Dukes, MD;  Location: [facility];  Service: Obstetrics;  Laterality: N/A;    Family History  Problem Relation Age of Onset  . Diabetes Mother   . Hypertension Mother   . Hypertension Father   . Hypertension Maternal Grandfather     Social History   Tobacco Use  . Smoking status: Never Smoker  . Smokeless tobacco: Never Used  Substance Use Topics  . Alcohol use: No  . Drug use: No    Prior to Admission medications   Medication Sig Start Date End Date Taking? Authorizing Provider  dicyclomine (BENTYL) 20 MG tablet Take 1 tablet (20 mg total) by mouth 2 (two) times daily. 12/11/17   Long, Arlyss Repress, MD  ondansetron (ZOFRAN ODT) 4 MG disintegrating tablet Take 1 tablet (4 mg total) by mouth every 8 (eight) hours as needed for nausea or vomiting. 12/11/17   Long, Arlyss Repress, MD  sitaGLIPtin-metformin (JANUMET) 50-1000 MG tablet Take 1 tablet by mouth 2 (two) times daily with a meal.    [provider]    Allergies Patient has no known  allergies.   REVIEW OF SYSTEMS  Negative except as noted here or in the History of Present Illness.   PHYSICAL EXAMINATION  Initial Vital Signs Blood pressure 110/71, pulse (!) 103, temperature 99.3 F (37.4 C), temperature source Oral, resp. rate 20, height 5\' 6"  (1.676 m), weight 108.9 kg, SpO2 100 %.  Examination General: Well-developed, well-nourished female in no acute distress; appearance consistent with age of record HENT: normocephalic; atraumatic Eyes: pupils equal, round and reactive to light; extraocular muscles intact Neck: supple Heart: regular rate and rhythm Lungs: clear to auscultation bilaterally Abdomen: soft; nondistended; epigastric tenderness; no masses or hepatosplenomegaly; bowel sounds present Extremities: No deformity; full range of motion; pulses normal Neurologic: Awake, alert and oriented; motor function intact in all extremities and symmetric; no facial droop Skin: Warm and dry Psychiatric: Normal mood and affect   RESULTS  Summary of this visit's results, reviewed by myself:   EKG Interpretation  Date/Time:    Ventricular Rate:    PR Interval:    QRS Duration:   QT Interval:    QTC Calculation:   R Axis:     Text Interpretation:        Laboratory Studies: Results for orders placed or performed during the hospital encounter of 06/17/18 (from the past 24 hour(s))  CBC with Differential/Platelet     Status: Abnormal   Collection  Time: 06/17/18 11:40 PM  Result Value Ref Range   WBC 6.2 4.0 - 10.5 K/uL   RBC 4.77 3.87 - 5.11 MIL/uL   Hemoglobin 14.8 12.0 - 15.0 g/dL   HCT 16.143.4 09.636.0 - 04.546.0 %   MCV 91.0 80.0 - 100.0 fL   MCH 31.0 26.0 - 34.0 pg   MCHC 34.1 30.0 - 36.0 g/dL   RDW 40.911.0 (L) 81.111.5 - 91.415.5 %   Platelets 274 150 - 400 K/uL   nRBC 0.0 0.0 - 0.2 %   Neutrophils Relative % 74 %   Neutro Abs 4.6 1.7 - 7.7 K/uL   Lymphocytes Relative 19 %   Lymphs Abs 1.2 0.7 - 4.0 K/uL   Monocytes Relative 7 %   Monocytes Absolute 0.4 0.1 -  1.0 K/uL   Eosinophils Relative 0 %   Eosinophils Absolute 0.0 0.0 - 0.5 K/uL   Basophils Relative 0 %   Basophils Absolute 0.0 0.0 - 0.1 K/uL   Immature Granulocytes 0 %   Abs Immature Granulocytes 0.02 0.00 - 0.07 K/uL  Comprehensive metabolic panel     Status: Abnormal   Collection Time: 06/17/18 11:40 PM  Result Value Ref Range   Sodium 130 (L) 135 - 145 mmol/L   Potassium 3.7 3.5 - 5.1 mmol/L   Chloride 93 (L) 98 - 111 mmol/L   CO2 27 22 - 32 mmol/L   Glucose, Bld 346 (H) 70 - 99 mg/dL   BUN 14 6 - 20 mg/dL   Creatinine, Ser 7.820.61 0.44 - 1.00 mg/dL   Calcium 9.0 8.9 - 95.610.3 mg/dL   Total Protein 7.7 6.5 - 8.1 g/dL   Albumin 4.1 3.5 - 5.0 g/dL   AST 14 (L) 15 - 41 U/L   ALT 14 0 - 44 U/L   Alkaline Phosphatase 71 38 - 126 U/L   Total Bilirubin 0.8 0.3 - 1.2 mg/dL   GFR calc non Af Amer >60 >60 mL/min   GFR calc Af Amer >60 >60 mL/min   Anion gap 10 5 - 15  Urinalysis, Routine w reflex microscopic     Status: Abnormal   Collection Time: 06/17/18 11:40 PM  Result Value Ref Range   Color, Urine YELLOW YELLOW   APPearance CLOUDY (A) CLEAR   Specific Gravity, Urine 1.015 1.005 - 1.030   pH 6.0 5.0 - 8.0   Glucose, UA >=500 (A) NEGATIVE mg/dL   Hgb urine dipstick NEGATIVE NEGATIVE   Bilirubin Urine NEGATIVE NEGATIVE   Ketones, ur 40 (A) NEGATIVE mg/dL   Protein, ur NEGATIVE NEGATIVE mg/dL   Nitrite POSITIVE (A) NEGATIVE   Leukocytes, UA NEGATIVE NEGATIVE  Pregnancy, urine     Status: None   Collection Time: 06/17/18 11:40 PM  Result Value Ref Range   Preg Test, Ur NEGATIVE NEGATIVE  Urinalysis, Microscopic (reflex)     Status: Abnormal   Collection Time: 06/17/18 11:40 PM  Result Value Ref Range   RBC / HPF 0-5 0 - 5 RBC/hpf   WBC, UA 11-20 0 - 5 WBC/hpf   Bacteria, UA MANY (A) NONE SEEN   Squamous Epithelial / LPF 6-10 0 - 5   WBC Clumps PRESENT    Imaging Studies: No results found.  ED COURSE and MDM  Nursing notes and initial vitals signs, including pulse  oximetry, reviewed.  Vitals:   06/17/18 2232 06/17/18 2242  BP:  110/71  Pulse:  (!) 103  Resp:  20  Temp:  99.3 F (37.4 C)  TempSrc:  Oral  SpO2:  100%  Weight: 108.9 kg   Height: 5\' 6"  (1.676 m)    12:36 AM Patient tolerating fluids after IV fluids and Zofran.  Urine sent for culture.  Sugar elevated but patient was not able to take her anti-hyperglycemics earlier due to vomiting.  PROCEDURES    ED DIAGNOSES     ICD-10-CM   1. Nausea and vomiting in adult R11.2   2. Hyperglycemia R73.9   3. Lower urinary tract infectious disease N39.0        Jacaria Colburn, MD 06/18/18 [id number]

## 2018-06-17 NOTE — ED Triage Notes (Signed)
Pt c/o vomiting x 3 days. 

## 2018-06-18 LAB — COMPREHENSIVE METABOLIC PANEL WITH GFR
ALT: 14 U/L (ref 0–44)
AST: 14 U/L — ABNORMAL LOW (ref 15–41)
Albumin: 4.1 g/dL (ref 3.5–5.0)
Alkaline Phosphatase: 71 U/L (ref 38–126)
Anion gap: 10 (ref 5–15)
BUN: 14 mg/dL (ref 6–20)
CO2: 27 mmol/L (ref 22–32)
Calcium: 9 mg/dL (ref 8.9–10.3)
Chloride: 93 mmol/L — ABNORMAL LOW (ref 98–111)
Creatinine, Ser: 0.61 mg/dL (ref 0.44–1.00)
GFR calc Af Amer: >60 mL/min (ref >60)
GFR calc non Af Amer: >60 mL/min (ref >60)
Glucose, Bld: 346 mg/dL — ABNORMAL HIGH (ref 70–99)
Potassium: 3.7 mmol/L (ref 3.5–5.1)
Sodium: 130 mmol/L — ABNORMAL LOW (ref 135–145)
Total Bilirubin: 0.8 mg/dL (ref 0.3–1.2)
Total Protein: 7.7 g/dL (ref 6.5–8.1)

## 2018-06-18 LAB — URINALYSIS, ROUTINE W REFLEX MICROSCOPIC
BILIRUBIN URINE: NEGATIVE
HGB URINE DIPSTICK: NEGATIVE
Ketones, ur: 40 mg/dL — AB
Leukocytes, UA: NEGATIVE
Nitrite: POSITIVE — AB
PROTEIN: NEGATIVE mg/dL
SPECIFIC GRAVITY, URINE: 1.015 (ref 1.005–1.030)
pH: 6 (ref 5.0–8.0)

## 2018-06-18 LAB — URINALYSIS, MICROSCOPIC (REFLEX)

## 2018-06-18 MED ORDER — CEPHALEXIN 250 MG PO CAPS
500.0000 mg | ORAL_CAPSULE | Freq: Once | ORAL | Status: AC
  Administered 2018-06-18: 500 mg via ORAL
  Filled 2018-06-18: qty 2

## 2018-06-18 MED ORDER — CEPHALEXIN 500 MG PO CAPS: 500.0000 mg | ORAL_CAPSULE | Freq: Two times a day (BID) | ORAL | 0 refills | Status: DC

## 2018-06-18 MED ORDER — ONDANSETRON 8 MG PO TBDP: 8.0000 mg | ORAL_TABLET | Freq: Three times a day (TID) | ORAL | 0 refills | Status: DC | PRN

## 2018-06-18 MED ORDER — INSULIN ASPART 100 UNIT/ML ~~LOC~~ SOLN
10.0000 [IU] | Freq: Once | SUBCUTANEOUS | Status: AC
  Administered 2018-06-18: 10 [IU] via SUBCUTANEOUS
  Filled 2018-06-18: qty 1

## 2018-06-18 NOTE — ED Notes (Signed)
Given gingerale at this time

## 2018-06-18 NOTE — ED Notes (Signed)
PT states understanding of care given, follow up care, and medication prescribed. PT ambulated from ED to car with a steady gait. 

## 2018-06-20 LAB — URINE CULTURE: Culture: >=100000 — AB

## 2018-06-21 ENCOUNTER — Telehealth: Payer: Self-pay | Admitting: Emergency Medicine

## 2018-06-21 NOTE — Telephone Encounter (Signed)
Post ED Visit - Positive Culture Follow-up  Culture report reviewed by antimicrobial stewardship pharmacist:  []  Enzo Bi, Pharm.D. []  Celedonio Miyamoto, Pharm.D., BCPS AQ-ID []  Garvin Fila, Pharm.D., BCPS []  Georgina Pillion, Pharm.D., BCPS []  [place], [place].D., BCPS, AAHIVP []  Estella Husk, Pharm.D., BCPS, AAHIVP [x]  Lysle Pearl, PharmD, BCPS []  Phillips Climes, PharmD, BCPS []  Agapito Games, PharmD, BCPS []  Verlan Friends, PharmD  Positive urine culture Treated with Cephalexin, organism sensitive to the same and no further patient follow-up is required at this time.  Carollee Herter Diron Haddon 06/21/2018, 10:40 AM

## 2019-01-18 ENCOUNTER — Other Ambulatory Visit: Payer: Self-pay

## 2019-01-18 ENCOUNTER — Emergency Department (HOSPITAL_BASED_OUTPATIENT_CLINIC_OR_DEPARTMENT_OTHER)
Admission: EM | Admit: 2019-01-18 | Discharge: 2019-01-18 | Disposition: A | Discharge status: Home / Self Care | Payer: HRSA Program | Attending: Emergency Medicine | Admitting: Emergency Medicine

## 2019-01-18 ENCOUNTER — Encounter (HOSPITAL_BASED_OUTPATIENT_CLINIC_OR_DEPARTMENT_OTHER): Payer: Self-pay

## 2019-01-18 ENCOUNTER — Emergency Department (HOSPITAL_BASED_OUTPATIENT_CLINIC_OR_DEPARTMENT_OTHER): Payer: HRSA Program

## 2019-01-18 DIAGNOSIS — M7918 Myalgia, other site: Secondary | ICD-10-CM | POA: Diagnosis not present

## 2019-01-18 DIAGNOSIS — E119 Type 2 diabetes mellitus without complications: Secondary | ICD-10-CM | POA: Insufficient documentation

## 2019-01-18 DIAGNOSIS — U071 COVID-19: Secondary | ICD-10-CM | POA: Insufficient documentation

## 2019-01-18 DIAGNOSIS — R197 Diarrhea, unspecified: Secondary | ICD-10-CM | POA: Insufficient documentation

## 2019-01-18 DIAGNOSIS — R51 Headache: Secondary | ICD-10-CM | POA: Insufficient documentation

## 2019-01-18 DIAGNOSIS — R438 Other disturbances of smell and taste: Secondary | ICD-10-CM | POA: Insufficient documentation

## 2019-01-18 DIAGNOSIS — Z7984 Long term (current) use of oral hypoglycemic drugs: Secondary | ICD-10-CM | POA: Insufficient documentation

## 2019-01-18 DIAGNOSIS — Z20822 Contact with and (suspected) exposure to covid-19: Secondary | ICD-10-CM

## 2019-01-18 DIAGNOSIS — R0602 Shortness of breath: Secondary | ICD-10-CM | POA: Diagnosis not present

## 2019-01-18 DIAGNOSIS — R110 Nausea: Secondary | ICD-10-CM | POA: Diagnosis not present

## 2019-01-18 DIAGNOSIS — R509 Fever, unspecified: Secondary | ICD-10-CM | POA: Diagnosis present

## 2019-01-18 LAB — CBC WITH DIFFERENTIAL/PLATELET
Abs Immature Granulocytes: 0 10*3/uL (ref 0.00–0.07)
Basophils Absolute: 0 10*3/uL (ref 0.0–0.1)
Basophils Relative: 0 %
Eosinophils Absolute: 0 10*3/uL (ref 0.0–0.5)
Eosinophils Relative: 0 %
HCT: 41.9 % (ref 36.0–46.0)
Hemoglobin: 14.3 g/dL (ref 12.0–15.0)
Immature Granulocytes: 0 %
Lymphocytes Relative: 27 %
Lymphs Abs: 0.8 10*3/uL (ref 0.7–4.0)
MCH: 30.4 pg (ref 26.0–34.0)
MCHC: 34.1 g/dL (ref 30.0–36.0)
MCV: 89.1 fL (ref 80.0–100.0)
Monocytes Absolute: 0.3 10*3/uL (ref 0.1–1.0)
Monocytes Relative: 9 %
Neutro Abs: 1.9 10*3/uL (ref 1.7–7.7)
Neutrophils Relative %: 64 %
Platelets: 220 10*3/uL (ref 150–400)
RBC: 4.7 MIL/uL (ref 3.87–5.11)
RDW: 10.7 % — ABNORMAL LOW (ref 11.5–15.5)
WBC: 3 10*3/uL — ABNORMAL LOW (ref 4.0–10.5)
nRBC: 0 % (ref 0.0–0.2)

## 2019-01-18 LAB — COMPREHENSIVE METABOLIC PANEL
ALT: 13 U/L (ref 0–44)
AST: 13 U/L — ABNORMAL LOW (ref 15–41)
Albumin: 3.6 g/dL (ref 3.5–5.0)
Alkaline Phosphatase: 73 U/L (ref 38–126)
Anion gap: 13 (ref 5–15)
BUN: 10 mg/dL (ref 6–20)
CO2: 21 mmol/L — ABNORMAL LOW (ref 22–32)
Calcium: 8.7 mg/dL — ABNORMAL LOW (ref 8.9–10.3)
Chloride: 94 mmol/L — ABNORMAL LOW (ref 98–111)
Creatinine, Ser: 0.65 mg/dL (ref 0.44–1.00)
GFR calc Af Amer: >60 mL/min (ref >60)
GFR calc non Af Amer: >60 mL/min (ref >60)
Glucose, Bld: 293 mg/dL — ABNORMAL HIGH (ref 70–99)
Potassium: 4 mmol/L (ref 3.5–5.1)
Sodium: 128 mmol/L — ABNORMAL LOW (ref 135–145)
Total Bilirubin: 0.7 mg/dL (ref 0.3–1.2)
Total Protein: 7.5 g/dL (ref 6.5–8.1)

## 2019-01-18 LAB — PREGNANCY, URINE: Preg Test, Ur: NEGATIVE

## 2019-01-18 LAB — LIPASE, BLOOD: Lipase: 23 U/L (ref 11–51)

## 2019-01-18 MED ORDER — AZITHROMYCIN 250 MG PO TABS
500.0000 mg | ORAL_TABLET | Freq: Once | ORAL | Status: AC
  Administered 2019-01-18: 500 mg via ORAL
  Filled 2019-01-18: qty 2

## 2019-01-18 MED ORDER — SODIUM CHLORIDE 0.9 % IV SOLN: INTRAVENOUS | Status: DC

## 2019-01-18 MED ORDER — ACETAMINOPHEN 325 MG PO TABS
650.0000 mg | ORAL_TABLET | Freq: Once | ORAL | Status: AC | PRN
  Administered 2019-01-18: 650 mg via ORAL
  Filled 2019-01-18: qty 2

## 2019-01-18 MED ORDER — ACETAMINOPHEN 325 MG PO TABS: 650.0000 mg | ORAL_TABLET | Freq: Once | ORAL | Status: DC

## 2019-01-18 MED ORDER — PULMICORT FLEXHALER 180 MCG/ACT IN AEPB: 1.0000 | INHALATION_SPRAY | Freq: Two times a day (BID) | RESPIRATORY_TRACT | 0 refills | Status: AC

## 2019-01-18 MED ORDER — SODIUM CHLORIDE 0.9 % IV BOLUS
1000.0000 mL | Freq: Once | INTRAVENOUS | Status: AC
  Administered 2019-01-18: 1000 mL via INTRAVENOUS

## 2019-01-18 MED ORDER — AZITHROMYCIN 250 MG PO TABS: 250.0000 mg | ORAL_TABLET | Freq: Every day | ORAL | 0 refills | Status: AC

## 2019-01-18 NOTE — Discharge Instructions (Signed)
Work note provided.  Suspected COVID-19 infection.  Take the antibiotic Zithromax as directed.  Use the Pulmicort inhaler as directed.  Return for any new or worse symptoms.  Return for any worse trouble breathing.

## 2019-01-18 NOTE — ED Notes (Signed)
Discussed with patient about strict instructions of self quarantine. Also discussed strict return precautions. Pt verbalized understanding and is agreeable to plan  

## 2019-01-18 NOTE — ED Provider Notes (Signed)
[place] EMERGENCY DEPARTMENT Provider Note   CSN: [id number] Arrival date & time: 01/18/19  1745    History   Chief Complaint Chief Complaint  Patient presents with  . Fever    HPI Diana Snyder is a 23 y.o. female.     Patient with a complaint of fever mild headache body aches some loose bowel movements nausea no vomiting loss of taste loss of smell since Thursday.  Patient works at the [employer] center.  Has had exposure to COVID-19 infections.  Onset of symptoms were this past Thursday.  Patient started to feel more shortness of breath in the past few days.     Past Medical History:  Diagnosis Date  . Diabetes mellitus without complication [facility])     Patient Active Problem List   Diagnosis Date Noted  . Gestational diabetes mellitus, class B1 09/25/2013  . Preexisting Class B diabetes complicating pregnancy, antepartum 05/31/2013  . Pyelonephritis complicating pregnancy, antepartum 05/31/2013    Past Surgical History:  Procedure Laterality Date  . CESAREAN SECTION N/A 09/27/2013   Procedure: CESAREAN SECTION;  Surgeon: Guss Bunde, MD;  Location: [facility];  Service: Obstetrics;  Laterality: N/A;     OB History    Gravida  1   Para  1   Term  1   Preterm      AB      Living  1     SAB      TAB      Ectopic      Multiple      Live Births  1            Home Medications    Prior to Admission medications   Medication Sig Start Date End Date Taking? Authorizing Provider  azithromycin (ZITHROMAX) 250 MG tablet Take 1 tablet (250 mg total) by mouth daily. Take first 2 tablets together, then 1 every day until finished. 01/18/19   Fredia Sorrow, MD  budesonide (PULMICORT FLEXHALER) 180 MCG/ACT inhaler Inhale 1 puff into the lungs 2 (two) times daily for 7 days. 01/18/19 01/25/19  Fredia Sorrow, MD  cephALEXin (KEFLEX) 500 MG capsule Take 1 capsule (500 mg total) by mouth 2 (two) times daily. 06/18/18   Molpus, John, MD   dicyclomine (BENTYL) 20 MG tablet Take 1 tablet (20 mg total) by mouth 2 (two) times daily. 12/11/17   Long, Wonda Olds, MD  ondansetron (ZOFRAN ODT) 8 MG disintegrating tablet Take 1 tablet (8 mg total) by mouth every 8 (eight) hours as needed for nausea or vomiting. 06/18/18   Molpus, John, MD  sitaGLIPtin-metformin (JANUMET) 50-1000 MG tablet Take 1 tablet by mouth 2 (two) times daily with a meal.    [provider]    Family History Family History  Problem Relation Age of Onset  . Diabetes Mother   . Hypertension Mother   . Hypertension Father   . Hypertension Maternal Grandfather     Social History Social History   Tobacco Use  . Smoking status: Never Smoker  . Smokeless tobacco: Never Used  Substance Use Topics  . Alcohol use: Yes  . Drug use: No     Allergies   Patient has no known allergies.   Review of Systems Review of Systems  Constitutional: Positive for fever. Negative for chills.  HENT: Negative for rhinorrhea and sore throat.   Eyes: Negative for visual disturbance.  Respiratory: Positive for shortness of breath.   Cardiovascular: Negative for chest pain and leg  swelling.  Gastrointestinal: Positive for diarrhea. Negative for abdominal pain, nausea and vomiting.  Genitourinary: Negative for dysuria.  Musculoskeletal: Positive for myalgias. Negative for back pain and neck pain.  Skin: Negative for rash.  Neurological: Negative for dizziness, light-headedness and headaches.  Hematological: Does not bruise/bleed easily.  Psychiatric/Behavioral: Negative for confusion.     Physical Exam Updated Vital Signs BP 120/75   Pulse 92   Temp 98.8 F (37.1 C) (Oral)   Resp 20   Ht 1.676 m (5\' 6" )   Wt 104.3 kg   LMP 01/15/2019   SpO2 97%   BMI 37.12 kg/m   Physical Exam Vitals signs and nursing note reviewed.  Constitutional:      General: She is not in acute distress.    Appearance: Normal appearance. She is well-developed.  HENT:     Head:  Normocephalic and atraumatic.  Eyes:     Extraocular Movements: Extraocular movements intact.     Conjunctiva/sclera: Conjunctivae normal.     Pupils: Pupils are equal, round, and reactive to light.  Neck:     Musculoskeletal: Normal range of motion and neck supple.  Cardiovascular:     Rate and Rhythm: Normal rate and regular rhythm.     Heart sounds: No murmur.  Pulmonary:     Effort: Pulmonary effort is normal. No respiratory distress.     Breath sounds: Normal breath sounds.  Abdominal:     Palpations: Abdomen is soft.     Tenderness: There is no abdominal tenderness.  Musculoskeletal: Normal range of motion.  Skin:    General: Skin is warm and dry.  Neurological:     General: No focal deficit present.     Mental Status: She is alert and oriented to person, place, and time.      ED Treatments / Results  Labs (all labs ordered are listed, but only abnormal results are displayed) Labs Reviewed  CBC WITH DIFFERENTIAL/PLATELET - Abnormal; Notable for the following components:      Result Value   WBC 3.0 (*)    RDW 10.7 (*)    All other components within normal limits  COMPREHENSIVE METABOLIC PANEL - Abnormal; Notable for the following components:   Sodium 128 (*)    Chloride 94 (*)    CO2 21 (*)    Glucose, Bld 293 (*)    Calcium 8.7 (*)    AST 13 (*)    All other components within normal limits  NOVEL CORONAVIRUS, NAA (HOSPITAL ORDER, SEND-OUT TO REF LAB)  LIPASE, BLOOD  PREGNANCY, URINE    EKG None    ED ECG REPORT   Date: 01/18/2019  Rate: 90  Rhythm: normal sinus rhythm  QRS Axis: normal  Intervals: normal  ST/T Wave abnormalities: normal  Conduction Disutrbances:none  Narrative Interpretation:   Old EKG Reviewed: none available  I have personally reviewed the EKG tracing and agree with the computerized printout as noted.  Radiology Dg Chest Port 1 View  Result Date: 01/18/2019 CLINICAL DATA:  Fever EXAM: PORTABLE CHEST 1 VIEW COMPARISON:   None. FINDINGS: There is a possible retrocardiac opacity. No definite pneumothorax. No large pleural effusion. The heart size is normal. There is no acute osseous abnormality. IMPRESSION: Possible retrocardiac opacity concerning for atelectasis or pneumonia. This can be further evaluated with a two-view chest x-ray as clinically indicated. Electronically Signed   By: Katherine Mantlehristopher  Green M.D.   On: 01/18/2019 18:52    Procedures Procedures (including critical care time)  Medications Ordered in  ED Medications  0.9 %  sodium chloride infusion (has no administration in time range)  acetaminophen (TYLENOL) tablet 650 mg (650 mg Oral Not Given 01/18/19 1915)  acetaminophen (TYLENOL) tablet 650 mg (650 mg Oral Given 01/18/19 1807)  sodium chloride 0.9 % bolus 1,000 mL (1,000 mLs Intravenous New Bag/Given 01/18/19 1856)  azithromycin (ZITHROMAX) tablet 500 mg (500 mg Oral Given 01/18/19 2110)     Initial Impression / Assessment and Plan / ED Course  I have reviewed the triage vital signs and the nursing notes.  Pertinent labs & imaging results that were available during my care of the patient were reviewed by me and considered in my medical decision making (see chart for details).       Patient's oxygen saturations are in the mid to upper 90s.  No increased respiratory rate.  Patient did come in with a temp of 100.9.  Now afebrile.  Was a little tachycardic.  Patient received IV fluids chest x-ray raise some concerns of perhaps an early pneumonia.  But no evidence of any bilateral multifocal pneumonia.  Patient symptoms are very much consistent with COVID-19 infection.  Particularly with her loss of taste and smell.  Patient treated here with Zithromax.  Will be congestion continued with Zithromax at home and will be started on Pulmicort for the shortness of breath.  Patient given work excuse.  Outpatient COVID testing was done.  EKG showed no prolonged QT intervals.   Final Clinical Impressions(s) /  ED Diagnoses   Final diagnoses:  Suspected Covid-19 Virus Infection    ED Discharge Orders         Ordered    azithromycin (ZITHROMAX) 250 MG tablet  Daily     01/18/19 2213    budesonide (PULMICORT FLEXHALER) 180 MCG/ACT inhaler  2 times daily     01/18/19 2213           Vanetta MuldersZackowski, Justus Duerr, MD 01/18/19 2217

## 2019-01-18 NOTE — ED Triage Notes (Signed)
Pt c/o fever, HA, body aches, diarrhea, nausea, loss of taste and smell since Thursday. Pt states she works at [employer] where there have been outbreaks of [place].

## 2019-01-23 LAB — NOVEL CORONAVIRUS, NAA (HOSP ORDER, SEND-OUT TO REF LAB; TAT 18-24 HRS): SARS-CoV-2, NAA: DETECTED — AB

## 2019-03-15 ENCOUNTER — Emergency Department (HOSPITAL_BASED_OUTPATIENT_CLINIC_OR_DEPARTMENT_OTHER)
Admission: EM | Admit: 2019-03-15 | Discharge: 2019-03-15 | Disposition: A | Discharge status: Home / Self Care | Payer: Medicaid Other | Attending: Emergency Medicine | Admitting: Emergency Medicine

## 2019-03-15 ENCOUNTER — Other Ambulatory Visit: Payer: Self-pay

## 2019-03-15 ENCOUNTER — Encounter (HOSPITAL_BASED_OUTPATIENT_CLINIC_OR_DEPARTMENT_OTHER): Payer: Self-pay

## 2019-03-15 DIAGNOSIS — R739 Hyperglycemia, unspecified: Secondary | ICD-10-CM

## 2019-03-15 DIAGNOSIS — E1165 Type 2 diabetes mellitus with hyperglycemia: Secondary | ICD-10-CM | POA: Insufficient documentation

## 2019-03-15 DIAGNOSIS — B9689 Other specified bacterial agents as the cause of diseases classified elsewhere: Secondary | ICD-10-CM | POA: Insufficient documentation

## 2019-03-15 DIAGNOSIS — B3731 Acute candidiasis of vulva and vagina: Secondary | ICD-10-CM

## 2019-03-15 DIAGNOSIS — Z7984 Long term (current) use of oral hypoglycemic drugs: Secondary | ICD-10-CM | POA: Insufficient documentation

## 2019-03-15 DIAGNOSIS — N760 Acute vaginitis: Secondary | ICD-10-CM | POA: Insufficient documentation

## 2019-03-15 DIAGNOSIS — B373 Candidiasis of vulva and vagina: Secondary | ICD-10-CM | POA: Insufficient documentation

## 2019-03-15 LAB — URINALYSIS, ROUTINE W REFLEX MICROSCOPIC
Bilirubin Urine: NEGATIVE
Glucose, UA: >=500 mg/dL — AB
Hgb urine dipstick: NEGATIVE
Ketones, ur: 15 mg/dL — AB
Leukocytes,Ua: NEGATIVE
Nitrite: NEGATIVE
Protein, ur: NEGATIVE mg/dL
Specific Gravity, Urine: 1.01 (ref 1.005–1.030)
pH: 6 (ref 5.0–8.0)

## 2019-03-15 LAB — CBG MONITORING, ED: Glucose-Capillary: 343 mg/dL — ABNORMAL HIGH (ref 70–99)

## 2019-03-15 LAB — WET PREP, GENITAL
Sperm: NONE SEEN
Trich, Wet Prep: NONE SEEN

## 2019-03-15 LAB — URINALYSIS, MICROSCOPIC (REFLEX)

## 2019-03-15 LAB — PREGNANCY, URINE: Preg Test, Ur: NEGATIVE

## 2019-03-15 MED ORDER — METRONIDAZOLE 500 MG PO TABS: 500.0000 mg | ORAL_TABLET | Freq: Two times a day (BID) | ORAL | 0 refills | Status: AC

## 2019-03-15 MED ORDER — FLUCONAZOLE 150 MG PO TABS
150.0000 mg | ORAL_TABLET | Freq: Every day | ORAL | Status: DC
  Administered 2019-03-15: 150 mg via ORAL
  Filled 2019-03-15 (×2): qty 1

## 2019-03-15 MED ORDER — SODIUM CHLORIDE 0.9 % IV BOLUS
500.0000 mL | Freq: Once | INTRAVENOUS | Status: AC
  Administered 2019-03-15: 18:00:00 500 mL via INTRAVENOUS

## 2019-03-15 NOTE — ED Provider Notes (Signed)
[place] EMERGENCY DEPARTMENT Provider Note   CSN: [id number] Arrival date & time: 03/15/19  1524     History   Chief Complaint Chief Complaint  Patient presents with  . Pelvic Pain    HPI Diana Snyder is a 23 y.o. female history of diabetes.  Patient presents for itching vaginal discomfort and mild abdominal pain for 2 days patient states pain is 3/10 and characterizes pain as bloating "feels like gas", constant and unchanged.  Any aggravating or mitigating factors and has taken no medications for the pain.  Patient states she has had yeast infections in the past however she feels she has something else going on due to the abdominal pain.  Patient states that she has a IUD that is been in place for 4 years but is concerned that it may have moved.   Patient denies urinary frequency or urgency, fevers, vaginal discharge or blood in her urine.  Patient states she uses condoms when she has sexual intercourse and is monogamous with single partner who she states is clean.       HPI  Past Medical History:  Diagnosis Date  . Diabetes mellitus without complication [facility])     Patient Active Problem List   Diagnosis Date Noted  . Gestational diabetes mellitus, class B1 09/25/2013  . Preexisting Class B diabetes complicating pregnancy, antepartum 05/31/2013  . Pyelonephritis complicating pregnancy, antepartum 05/31/2013    Past Surgical History:  Procedure Laterality Date  . CESAREAN SECTION N/A 09/27/2013   Procedure: CESAREAN SECTION;  Surgeon: Guss Bunde, MD;  Location: [facility];  Service: Obstetrics;  Laterality: N/A;     OB History    Gravida  1   Para  1   Term  1   Preterm      AB      Living  1     SAB      TAB      Ectopic      Multiple      Live Births  1            Home Medications    Prior to Admission medications   Medication Sig Start Date End Date Taking? Authorizing Provider  azithromycin (ZITHROMAX) 250 MG  tablet Take 1 tablet (250 mg total) by mouth daily. Take first 2 tablets together, then 1 every day until finished. 01/18/19   Fredia Sorrow, MD  budesonide (PULMICORT FLEXHALER) 180 MCG/ACT inhaler Inhale 1 puff into the lungs 2 (two) times daily for 7 days. 01/18/19 01/25/19  Fredia Sorrow, MD  cephALEXin (KEFLEX) 500 MG capsule Take 1 capsule (500 mg total) by mouth 2 (two) times daily. 06/18/18   Molpus, John, MD  dicyclomine (BENTYL) 20 MG tablet Take 1 tablet (20 mg total) by mouth 2 (two) times daily. 12/11/17   Long, Wonda Olds, MD  metroNIDAZOLE (FLAGYL) 500 MG tablet Take 1 tablet (500 mg total) by mouth 2 (two) times daily. 03/15/19   Reneka Nebergall S, PA  ondansetron (ZOFRAN ODT) 8 MG disintegrating tablet Take 1 tablet (8 mg total) by mouth every 8 (eight) hours as needed for nausea or vomiting. 06/18/18   Molpus, John, MD  sitaGLIPtin-metformin (JANUMET) 50-1000 MG tablet Take 1 tablet by mouth 2 (two) times daily with a meal.    [provider]    Family History Family History  Problem Relation Age of Onset  . Diabetes Mother   . Hypertension Mother   . Hypertension Father   .  Hypertension Maternal Grandfather     Social History Social History   Tobacco Use  . Smoking status: Never Smoker  . Smokeless tobacco: Never Used  Substance Use Topics  . Alcohol use: Yes  . Drug use: No     Allergies   Patient has no known allergies.   Review of Systems Review of Systems  Constitutional: Negative for chills and fever.  HENT: Negative for congestion and ear pain.   Eyes: Negative for pain.  Respiratory: Negative for cough and shortness of breath.   Cardiovascular: Negative for chest pain.  Gastrointestinal: Positive for abdominal pain. Negative for blood in stool, constipation, diarrhea, nausea and vomiting.  Endocrine: Negative for polydipsia and polyuria.  Genitourinary: Positive for pelvic pain. Negative for difficulty urinating, dysuria, hematuria and vaginal  discharge.  Musculoskeletal: Negative for myalgias.  Neurological: Negative for dizziness and headaches.     Physical Exam Updated Vital Signs BP (!) 145/105 (BP Location: Right Arm)   Pulse 89   Temp 98.4 F (36.9 C) (Oral)   Resp 18   Ht  (1.676 m)   Wt 104.3 kg   SpO2 100%   BMI 37.12 kg/m   Physical Exam Vitals signs and nursing note reviewed. Exam conducted with a chaperone present.  Constitutional:      General: She is not in acute distress.    Appearance: She is not ill-appearing or toxic-appearing.  HENT:     Head: Normocephalic and atraumatic.  Eyes:     General: No scleral icterus. Neck:     Musculoskeletal: No neck rigidity.  Cardiovascular:     Rate and Rhythm: Normal rate and regular rhythm.     Pulses: Normal pulses.     Heart sounds: Normal heart sounds.  Pulmonary:     Effort: Pulmonary effort is normal.     Breath sounds: Normal breath sounds.  Abdominal:     General: Abdomen is flat. Bowel sounds are normal. There is no distension.     Palpations: Abdomen is soft.     Tenderness: There is abdominal tenderness (suprapubic). There is no right CVA tenderness, left CVA tenderness, guarding or rebound.  Genitourinary:    Comments: External vulva with crusty white discharge present. Cervix appears normal.  Closed. No adnexal tenderness or CMT White discharge present in vaginal vault Musculoskeletal:     Right lower leg: No edema.     Left lower leg: No edema.  Skin:    General: Skin is warm and dry.     Capillary Refill: Capillary refill takes less than 2 seconds.  Neurological:     Mental Status: She is alert. Mental status is at baseline.  Psychiatric:        Behavior: Behavior normal.      ED Treatments / Results  Labs (all labs ordered are listed, but only abnormal results are displayed) Labs Reviewed  WET PREP, GENITAL - Abnormal; Notable for the following components:      Result Value   Yeast Wet Prep HPF POC PRESENT (*)     Clue Cells Wet Prep HPF POC PRESENT (*)    WBC, Wet Prep HPF POC MANY (*)    All other components within normal limits  URINALYSIS, ROUTINE W REFLEX MICROSCOPIC - Abnormal; Notable for the following components:   APPearance CLOUDY (*)    Glucose, UA >=500 (*)    Ketones, ur 15 (*)    All other components within normal limits  URINALYSIS, MICROSCOPIC (REFLEX) - Abnormal; Notable for  the following components:   Bacteria, UA MANY (*)    All other components within normal limits  CBG MONITORING, ED - Abnormal; Notable for the following components:   Glucose-Capillary 343 (*)    All other components within normal limits  PREGNANCY, URINE  GC/CHLAMYDIA PROBE AMP ([place]) NOT AT [facility]    EKG None  Radiology No results found.  Procedures Procedures (including critical care time)  Medications Ordered in ED Medications  fluconazole (DIFLUCAN) tablet 150 mg (has no administration in time range)  sodium chloride 0.9 % bolus 500 mL (has no administration in time range)     Initial Impression / Assessment and Plan / ED Course  I have reviewed the triage vital signs and the nursing notes.  Pertinent labs & imaging results that were available during my care of the patient were reviewed by me and considered in my medical decision making (see chart for details).        Patient is 24 year old female with history of DM on sitagliptin/metformin.  Presents to ED today with concern for yeast infection.  Patient found to have yeast and clue cells with WBCs present on wet prep patient incidentally found to have high level of glucose in her urine and a CBG at 343.  Treated for BV and Candida vaginitis and given 500 mL normal saline.  Patient has no symptoms of hyperglycemia at this time however discussed importance of using her diabetes medications as prescribed and follow-up with PCP.  Discussed with patient need for follow-up with primary care to control blood sugar.  Although no PCP is  listed in our system patient states that she has primary care physician who prescribes her sitagliptan/metformin.  Patient with [facility] information.      The patient appears reasonably screened and/or stabilized for discharge and I doubt any other medical condition or other [facility] requiring further screening, evaluation, or treatment in the ED at this time prior to discharge.  Patient is hemodynamically stable, in NAD, and able to ambulate in the ED. Pain has been managed or a plan has been made for home management and has no complaints prior to discharge. Patient is comfortable with above plan and is stable for discharge at this time. All questions were answered prior to disposition. Results from the ER workup discussed with the patient face to face and all questions answered to the best of my ability. The patient is safe for discharge with strict return precautions. Patient appears safe for discharge with appropriate follow-up.  Conveyed my impression with the patient and he voiced understanding and is agreeable to plan.   An After Visit Summary was printed and given to the patient.  Portions of this note were generated with Scientist, clinical (histocompatibility and immunogenetics). Dictation errors may occur despite best attempts at proofreading.    Final Clinical Impressions(s) / ED Diagnoses   Final diagnoses:  Bacterial vaginosis  Candida vaginitis    ED Discharge Orders         Ordered    metroNIDAZOLE (FLAGYL) 500 MG tablet  2 times daily     03/15/19 1748           Gailen Shelter, [place] 03/16/19 0039    Virgina Norfolk, DO 03/16/19 1715

## 2019-03-15 NOTE — ED Triage Notes (Signed)
Pt c/o pelvic and vaginal pain. Pt reports pain with urination. Pt states she has an IUD in place. Denies vaginal bleeding or discharge.

## 2019-03-15 NOTE — Discharge Instructions (Addendum)
You are diagnosed with a yeast infection today as well as bacterial vaginosis.  Please take antibiotics as prescribed.  Please follow-up with your primary care in 2 to 3 days for reassessment of blood sugar control.

## 2019-03-17 LAB — GC/CHLAMYDIA PROBE AMP (~~LOC~~) NOT AT ARMC
Chlamydia: NEGATIVE
Neisseria Gonorrhea: NEGATIVE

## 2019-05-27 ENCOUNTER — Encounter (HOSPITAL_BASED_OUTPATIENT_CLINIC_OR_DEPARTMENT_OTHER): Payer: Self-pay

## 2019-05-27 ENCOUNTER — Emergency Department (HOSPITAL_BASED_OUTPATIENT_CLINIC_OR_DEPARTMENT_OTHER): Payer: Self-pay

## 2019-05-27 ENCOUNTER — Emergency Department (HOSPITAL_BASED_OUTPATIENT_CLINIC_OR_DEPARTMENT_OTHER)
Admission: EM | Admit: 2019-05-27 | Discharge: 2019-05-27 | Disposition: A | Discharge status: Home / Self Care | Payer: Self-pay | Attending: Emergency Medicine | Admitting: Emergency Medicine

## 2019-05-27 ENCOUNTER — Other Ambulatory Visit: Payer: Self-pay

## 2019-05-27 DIAGNOSIS — Z7984 Long term (current) use of oral hypoglycemic drugs: Secondary | ICD-10-CM | POA: Insufficient documentation

## 2019-05-27 DIAGNOSIS — N3001 Acute cystitis with hematuria: Secondary | ICD-10-CM | POA: Insufficient documentation

## 2019-05-27 DIAGNOSIS — R739 Hyperglycemia, unspecified: Secondary | ICD-10-CM

## 2019-05-27 DIAGNOSIS — R109 Unspecified abdominal pain: Secondary | ICD-10-CM

## 2019-05-27 DIAGNOSIS — Z79899 Other long term (current) drug therapy: Secondary | ICD-10-CM | POA: Insufficient documentation

## 2019-05-27 DIAGNOSIS — Z20828 Contact with and (suspected) exposure to other viral communicable diseases: Secondary | ICD-10-CM | POA: Insufficient documentation

## 2019-05-27 DIAGNOSIS — E1165 Type 2 diabetes mellitus with hyperglycemia: Secondary | ICD-10-CM | POA: Insufficient documentation

## 2019-05-27 LAB — COMPREHENSIVE METABOLIC PANEL
ALT: 18 U/L (ref 0–44)
AST: 15 U/L (ref 15–41)
Albumin: 3.7 g/dL (ref 3.5–5.0)
Alkaline Phosphatase: 52 U/L (ref 38–126)
Anion gap: 7 (ref 5–15)
BUN: 10 mg/dL (ref 6–20)
CO2: 27 mmol/L (ref 22–32)
Calcium: 8.8 mg/dL — ABNORMAL LOW (ref 8.9–10.3)
Chloride: 99 mmol/L (ref 98–111)
Creatinine, Ser: 0.58 mg/dL (ref 0.44–1.00)
GFR calc Af Amer: >60 mL/min (ref >60)
GFR calc non Af Amer: >60 mL/min (ref >60)
Glucose, Bld: 328 mg/dL — ABNORMAL HIGH (ref 70–99)
Potassium: 4.2 mmol/L (ref 3.5–5.1)
Sodium: 133 mmol/L — ABNORMAL LOW (ref 135–145)
Total Bilirubin: 0.3 mg/dL (ref 0.3–1.2)
Total Protein: 6.8 g/dL (ref 6.5–8.1)

## 2019-05-27 LAB — PREGNANCY, URINE: Preg Test, Ur: NEGATIVE

## 2019-05-27 LAB — CBC WITH DIFFERENTIAL/PLATELET
Abs Immature Granulocytes: 0.01 10*3/uL (ref 0.00–0.07)
Basophils Absolute: 0 10*3/uL (ref 0.0–0.1)
Basophils Relative: 0 %
Eosinophils Absolute: 0 10*3/uL (ref 0.0–0.5)
Eosinophils Relative: 0 %
HCT: 38.9 % (ref 36.0–46.0)
Hemoglobin: 13.6 g/dL (ref 12.0–15.0)
Immature Granulocytes: 0 %
Lymphocytes Relative: 30 %
Lymphs Abs: 1.5 10*3/uL (ref 0.7–4.0)
MCH: 31.5 pg (ref 26.0–34.0)
MCHC: 35 g/dL (ref 30.0–36.0)
MCV: 90 fL (ref 80.0–100.0)
Monocytes Absolute: 0.4 10*3/uL (ref 0.1–1.0)
Monocytes Relative: 8 %
Neutro Abs: 3 10*3/uL (ref 1.7–7.7)
Neutrophils Relative %: 62 %
Platelets: 271 10*3/uL (ref 150–400)
RBC: 4.32 MIL/uL (ref 3.87–5.11)
RDW: 10.9 % — ABNORMAL LOW (ref 11.5–15.5)
WBC: 4.9 10*3/uL (ref 4.0–10.5)
nRBC: 0 % (ref 0.0–0.2)

## 2019-05-27 LAB — URINALYSIS, ROUTINE W REFLEX MICROSCOPIC
Bilirubin Urine: NEGATIVE
Glucose, UA: >=500 mg/dL — AB
Ketones, ur: NEGATIVE mg/dL
Leukocytes,Ua: NEGATIVE
Nitrite: NEGATIVE
Protein, ur: NEGATIVE mg/dL
Specific Gravity, Urine: 1.015 (ref 1.005–1.030)
pH: 6 (ref 5.0–8.0)

## 2019-05-27 LAB — URINALYSIS, MICROSCOPIC (REFLEX): WBC, UA: >50 WBC/hpf (ref 0–5)

## 2019-05-27 LAB — LIPASE, BLOOD: Lipase: 27 U/L (ref 11–51)

## 2019-05-27 LAB — CBG MONITORING, ED: Glucose-Capillary: 237 mg/dL — ABNORMAL HIGH (ref 70–99)

## 2019-05-27 MED ORDER — ONDANSETRON 4 MG PO TBDP: 4.0000 mg | ORAL_TABLET | Freq: Three times a day (TID) | ORAL | 0 refills | Status: DC | PRN

## 2019-05-27 MED ORDER — CEPHALEXIN 500 MG PO CAPS: 500.0000 mg | ORAL_CAPSULE | Freq: Two times a day (BID) | ORAL | 0 refills | Status: AC

## 2019-05-27 MED ORDER — FAMOTIDINE IN NACL 20-0.9 MG/50ML-% IV SOLN
20.0000 mg | Freq: Once | INTRAVENOUS | Status: AC
  Administered 2019-05-27: 20 mg via INTRAVENOUS
  Filled 2019-05-27: qty 50

## 2019-05-27 MED ORDER — SITAGLIPTIN PHOS-METFORMIN HCL 50-1000 MG PO TABS: 1.0000 | ORAL_TABLET | Freq: Two times a day (BID) | ORAL | 0 refills | Status: DC

## 2019-05-27 MED ORDER — SODIUM CHLORIDE 0.9 % IV BOLUS
1000.0000 mL | Freq: Once | INTRAVENOUS | Status: AC
  Administered 2019-05-27: 17:00:00 1000 mL via INTRAVENOUS

## 2019-05-27 MED ORDER — ALUM & MAG HYDROXIDE-SIMETH 200-200-20 MG/5ML PO SUSP
30.0000 mL | Freq: Once | ORAL | Status: AC
  Administered 2019-05-27: 19:00:00 30 mL via ORAL
  Filled 2019-05-27: qty 30

## 2019-05-27 MED ORDER — OMEPRAZOLE 20 MG PO CPDR: 20.0000 mg | DELAYED_RELEASE_CAPSULE | Freq: Every day | ORAL | 0 refills | Status: AC

## 2019-05-27 MED ORDER — SUCRALFATE 1 GM/10ML PO SUSP: 1.0000 g | Freq: Three times a day (TID) | ORAL | 0 refills | Status: AC

## 2019-05-27 MED ORDER — LIDOCAINE VISCOUS HCL 2 % MT SOLN
15.0000 mL | Freq: Once | OROMUCOSAL | Status: AC
  Administered 2019-05-27: 15 mL via ORAL
  Filled 2019-05-27: qty 15

## 2019-05-27 MED ORDER — SODIUM CHLORIDE 0.9 % IV BOLUS
1000.0000 mL | Freq: Once | INTRAVENOUS | Status: AC
  Administered 2019-05-27: 1000 mL via INTRAVENOUS

## 2019-05-27 MED ORDER — ONDANSETRON HCL 4 MG/2ML IJ SOLN
4.0000 mg | Freq: Once | INTRAMUSCULAR | Status: AC
  Administered 2019-05-27: 4 mg via INTRAVENOUS
  Filled 2019-05-27: qty 2

## 2019-05-27 NOTE — ED Triage Notes (Signed)
Pt c/o abd pain, fever x 2 days-states feels same as when she was dx with covid in July-NAD-steady gait

## 2019-05-27 NOTE — Discharge Instructions (Addendum)
You were seen in the emergency department today for abdominal pain and fever.  Your work-up showed that your blood sugar is quite elevated in the 300s, we are refilling your Janumet prescription, please be sure to take this as prescribed and monitor your blood sugars at home.  You will need to have your blood sugar rechecked within 1 week by your primary care provider.  Your urine showed that you have findings concerning for urinary tract infection, we are treating this with Keflex, an antibiotic, please take this as prescribed.  We also sending home with Carafate to take prior to meals and omeprazole to take once in the morning to help with acid in the stomach.  We are also sending you with Zofran to take every hours as needed for nausea and vomiting.  We have prescribed you new medication(s) today. Discuss the medications prescribed today with your pharmacist as they can have adverse effects and interactions with your other medicines including over the counter and prescribed medications. Seek medical evaluation if you start to experience new or abnormal symptoms after taking one of these medicines, seek care immediately if you start to experience difficulty breathing, feeling of your throat closing, facial swelling, or rash as these could be indications of a more serious allergic reaction  We have tested you for coronavirus, we will call you if results are positive within 48 hours.  Please be sure to quarantine.  Please follow-up with primary care within 3 days.  Return to the ER for new or worsening symptoms including but not limited to inability to keep fluids down, increasing pain, chest pain, trouble breathing, blood in your stool or vomit, or any other concerns.

## 2019-05-27 NOTE — ED Provider Notes (Signed)
[facility] DEPARTMENT Provider Note   CSN: [id number] Arrival date & time: 05/27/19  1351     History Chief Complaint  Patient presents with   Abdominal Pain    Diana Snyder is a 23 y.o. female with a history of DM who presents to the ED with complaints of fever and abdominal pain over the past couple of days.  Patient states she is had subjective fever and chills but has not taken her temperature at home.  She states that with this she has had some mild nausea, chills, dysuria, and abdominal pain to the epigastric area which occurs intermittently, is difficult to describe, has no alleviating/aggravating factors, and last for variable durations.  She has been taking Tylenol without much relief in her symptoms.  She states this feels somewhat similar to when she had Covid in July of this year.  Her friend was recently tested for Covid but has not gotten the results yet.  No definitive known exposures.  Last BM was yesterday and was a bit firm, she continues to pass gas.  Denies cough, dyspnea, chest pain, emesis, diarrhea, melena, hematochezia, urgency, frequency, or vaginal bleeding/discharge.  She states that she is sexually active in a monogamous relationship and does not have any concern about STDs.  HPI     Past Medical History:  Diagnosis Date   Diabetes mellitus without complication [facility])     Patient Active Problem List   Diagnosis Date Noted   Gestational diabetes mellitus, class B1 09/25/2013   Preexisting Class B diabetes complicating pregnancy, antepartum 05/31/2013   Pyelonephritis complicating pregnancy, antepartum 05/31/2013    Past Surgical History:  Procedure Laterality Date   CESAREAN SECTION N/A 09/27/2013   Procedure: CESAREAN SECTION;  Surgeon: Guss Bunde, MD;  Location: [facility];  Service: Obstetrics;  Laterality: N/A;     OB History    Gravida  1   Para  1   Term  1   Preterm      AB      Living  1     SAB      TAB      Ectopic      Multiple      Live Births  1           Family History  Problem Relation Age of Onset   Diabetes Mother    Hypertension Mother    Hypertension Father    Hypertension Maternal Grandfather     Social History   Tobacco Use   Smoking status: Never Smoker   Smokeless tobacco: Never Used  Substance Use Topics   Alcohol use: Yes    Comment: occ   Drug use: No    Home Medications Prior to Admission medications   Medication Sig Start Date End Date Taking? Authorizing Provider  azithromycin (ZITHROMAX) 250 MG tablet Take 1 tablet (250 mg total) by mouth daily. Take first 2 tablets together, then 1 every day until finished. 01/18/19   Fredia Sorrow, MD  budesonide (PULMICORT FLEXHALER) 180 MCG/ACT inhaler Inhale 1 puff into the lungs 2 (two) times daily for 7 days. 01/18/19 01/25/19  Fredia Sorrow, MD  cephALEXin (KEFLEX) 500 MG capsule Take 1 capsule (500 mg total) by mouth 2 (two) times daily. 06/18/18   Molpus, John, MD  dicyclomine (BENTYL) 20 MG tablet Take 1 tablet (20 mg total) by mouth 2 (two) times daily. 12/11/17   Long, Wonda Olds, MD  metroNIDAZOLE (FLAGYL) 500 MG tablet Take 1  tablet (500 mg total) by mouth 2 (two) times daily. 03/15/19   Fondaw, Wylder S, PA  ondansetron (ZOFRAN ODT) 8 MG disintegrating tablet Take 1 tablet (8 mg total) by mouth every 8 (eight) hours as needed for nausea or vomiting. 06/18/18   Molpus, John, MD  sitaGLIPtin-metformin (JANUMET) 50-1000 MG tablet Take 1 tablet by mouth 2 (two) times daily with a meal.    [provider]    Allergies    Patient has no known allergies.  Review of Systems   Review of Systems  Constitutional: Positive for chills and fever.  Respiratory: Negative for cough and shortness of breath.   Cardiovascular: Negative for chest pain.  Gastrointestinal: Positive for abdominal pain, constipation, nausea and vomiting. Negative for anal bleeding, blood in stool and diarrhea.    Genitourinary: Positive for dysuria. Negative for frequency, pelvic pain, urgency, vaginal bleeding and vaginal discharge.  Neurological: Negative for syncope.  All other systems reviewed and are negative.   Physical Exam Updated Vital Signs BP (!) 138/100 (BP Location: Left Arm)    Pulse 80    Temp 98.8 F (37.1 C) (Oral)    Resp 18    Ht 5\' 6"  (1.676 m)    Wt 101.2 kg    SpO2 100%    BMI 35.99 kg/m   Physical Exam Vitals and nursing note reviewed.  Constitutional:      General: She is not in acute distress.    Appearance: She is well-developed.  HENT:     Head: Normocephalic and atraumatic.     Right Ear: Ear canal normal. Tympanic membrane is not perforated, erythematous, retracted or bulging.     Left Ear: Ear canal normal. Tympanic membrane is not perforated, erythematous, retracted or bulging.     Ears:     Comments: No mastoid erythema/swelling/tenderness.     Nose:     Right Sinus: No maxillary sinus tenderness or frontal sinus tenderness.     Left Sinus: No maxillary sinus tenderness or frontal sinus tenderness.     Mouth/Throat:     Pharynx: Uvula midline. No oropharyngeal exudate or posterior oropharyngeal erythema.     Comments: Posterior oropharynx is symmetric appearing. Patient tolerating own secretions without difficulty. No trismus. No drooling. No hot potato voice. No swelling beneath the tongue, submandibular compartment is soft.  Eyes:     General:        Right eye: No discharge.        Left eye: No discharge.     Conjunctiva/sclera: Conjunctivae normal.     Pupils: Pupils are equal, round, and reactive to light.  Cardiovascular:     Rate and Rhythm: Normal rate and regular rhythm.     Heart sounds: No murmur.  Pulmonary:     Effort: Pulmonary effort is normal. No respiratory distress.     Breath sounds: Normal breath sounds. No wheezing, rhonchi or rales.  Abdominal:     General: There is no distension.     Palpations: Abdomen is soft.     Tenderness:  There is abdominal tenderness in the epigastric area. There is no guarding or rebound. Negative signs include Murphy's sign and McBurney's sign.  Musculoskeletal:     Cervical back: Normal range of motion and neck supple. No edema or rigidity.  Lymphadenopathy:     Cervical: No cervical adenopathy.  Skin:    General: Skin is warm and dry.     Findings: No rash.  Neurological:     Mental Status:  She is alert.  Psychiatric:        Behavior: Behavior normal.    ED Results / Procedures / Treatments   Labs (all labs ordered are listed, but only abnormal results are displayed) Labs Reviewed  URINALYSIS, ROUTINE W REFLEX MICROSCOPIC - Abnormal; Notable for the following components:      Result Value   APPearance CLOUDY (*)    Glucose, UA >=500 (*)    Hgb urine dipstick TRACE (*)    All other components within normal limits  COMPREHENSIVE METABOLIC PANEL - Abnormal; Notable for the following components:   Sodium 133 (*)    Glucose, Bld 328 (*)    Calcium 8.8 (*)    All other components within normal limits  CBC WITH DIFFERENTIAL/PLATELET - Abnormal; Notable for the following components:   RDW 10.9 (*)    All other components within normal limits  URINALYSIS, MICROSCOPIC (REFLEX) - Abnormal; Notable for the following components:   Bacteria, UA MANY (*)    All other components within normal limits  PREGNANCY, URINE  LIPASE, BLOOD  CBG MONITORING, ED    EKG None  Radiology US Abdomen Limited RUQ  Result Date: 05/27/2019 CLINICAL DATA:  Upper abdomen pain x 2 days; Covid positive 4 months ago; states this is how it started 4 months ago EXAM: ULTRASOUND ABDOMEN LIMITED RIGHT UPPER QUADRANT COMPARISON:  None. FINDINGS: Gallbladder: No gallstones or wall thickening visualized. No sonographic Murphy sign noted by sonographer. Common bile duct: Diameter: 3 mm Liver: No focal lesion identified. Within normal limits in parenchymal echogenicity. Portal vein is patent on color Doppler  imaging with normal direction of blood flow towards the liver. Other: None. IMPRESSION: Normal right upper quadrant ultrasound. Electronically Signed   By: Amie Portlandavid  Ormond M.D.   On: 05/27/2019 18:27    Procedures Procedures (including critical care time)  Medications Ordered in ED Medications - No data to display  ED Course  I have reviewed the triage vital signs and the nursing notes.  Pertinent labs & imaging results that were available during my care of the patient were reviewed by me and considered in my medical decision making (see chart for details).    MDM Rules/Calculators/A&P                      Patient presents to the ED with complaints of subjective fever & abdominal pain. Patient nontoxic appearing, in no apparent distress, vitals WNL other than elevated BP- doubt HTN emergency. On exam patient tender to the epigastric area, no peritoneal signs. Will evaluate with labs and RUQ US. Anti-acids, anti-emetics, and fluids administered.   ER work-up reviewed:  CBC: No anemia or leukocytosis. CMP: Hyperglycemia without anion gap elevation or acidosis.  Mild hypocalcemia and hyponatremia.  No significant electrolyte derangement.  LFTs and renal function preserved. Lipase: WNL. UA: Many bacteria, greater than 50 WBCs, glucose urea, no ketonuria.  Given her reports of dysuria feel it is reasonable to cover for UTI. Preg test: Negative. Imaging: Normal right upper quadrant ultrasound.  On repeat abdominal exam patient remains without peritoneal signs, doubt cholecystitis, pancreatitis, diverticulitis, appendicitis, bowel obstruction/perforation, PID, or ectopic pregnancy. Patient tolerating PO in the emergency department.  She remains afebrile in the ER without antipyretics.  Will send Covid swab given she states this feels similar to when she had covid.  Her epigastric discomfort may be GERD/PUD, will start PPI and Carafate.  Will cover for UTI with Keflex. .  We discussed option of  STD  testing which she declined. Hyperglycemic but not in DKA, improved some with fluids, she is out of her diabetes medicine- requesting refill which will be provided. I discussed results, treatment plan, need for PCP follow-up, and return precautions with the patient. Provided opportunity for questions, patient confirmed understanding and is in agreement with plan.    Diana Snyder was evaluated in Emergency Department on 05/27/2019 for the symptoms described in the history of present illness. He/she was evaluated in the context of the global COVID-19 pandemic, which necessitated consideration that the patient might be at risk for infection with the SARS-CoV-2 virus that causes COVID-19. Institutional protocols and algorithms that pertain to the evaluation of patients at risk for COVID-19 are in a state of rapid change based on information released by regulatory bodies including the CDC and federal and state organizations. These policies and algorithms were followed during the patient's care in the ED.   Final Clinical Impression(s) / ED Diagnoses Final diagnoses:  Abdominal pain  Acute cystitis with hematuria  Hyperglycemia    Rx / DC Orders ED Discharge Orders         Ordered    sitaGLIPtin-metformin (JANUMET) 50-1000 MG tablet  2 times daily with meals     05/27/19 1905    sucralfate (CARAFATE) 1 GM/10ML suspension  3 times daily with meals & bedtime     05/27/19 1905    omeprazole (PRILOSEC) 20 MG capsule  Daily     05/27/19 1905    cephALEXin (KEFLEX) 500 MG capsule  2 times daily     05/27/19 1905    ondansetron (ZOFRAN ODT) 4 MG disintegrating tablet  Every 8 hours PRN     05/27/19 1905           Desmond Lope 05/27/19 1909    Sabas Sous, MD 05/31/19 2006

## 2019-05-30 LAB — NOVEL CORONAVIRUS, NAA (HOSP ORDER, SEND-OUT TO REF LAB; TAT 18-24 HRS): SARS-CoV-2, NAA: NOT DETECTED

## 2019-05-31 LAB — URINE CULTURE: Culture: >=100000 — AB

## 2019-06-01 ENCOUNTER — Telehealth: Payer: Self-pay

## 2019-06-01 NOTE — Telephone Encounter (Signed)
Post ED Visit - Positive Culture Follow-up  Culture report reviewed by antimicrobial stewardship pharmacist: [place] Team []  Elenor Quinones, Pharm.D. []  Heide Guile, Pharm.D., BCPS AQ-ID []  Parks Neptune, Pharm.D., BCPS []  Alycia Rossetti, Pharm.D., BCPS []  [place], Pharm.D., BCPS, AAHIVP []  Legrand Como, Pharm.D., BCPS, AAHIVP []  Salome Arnt, PharmD, BCPS []  Johnnette Gourd, PharmD, BCPS []  Hughes Better, PharmD, BCPS []  Leeroy Cha, PharmD [x]  Laqueta Linden, PharmD, BCPS []  Albertina Parr, PharmD  [place] Team []  Leodis Sias, PharmD []  Lindell Spar, PharmD []  Royetta Asal, PharmD []  Graylin Shiver, Rph []  Rema Fendt) Glennon Mac, PharmD []  Arlyn Dunning, PharmD []  Netta Cedars, PharmD []  Dia Sitter, PharmD []  Leone Haven, PharmD []  Gretta Arab, PharmD []  Theodis Shove, PharmD []  Peggyann Juba, PharmD []  Reuel Boom, PharmD   Positive urine culture Treated with Cephalexin, organism sensitive to the same and no further patient follow-up is required at this time.  Genia Del 06/01/2019, 11:41 AM

## 2019-06-16 ENCOUNTER — Encounter (HOSPITAL_BASED_OUTPATIENT_CLINIC_OR_DEPARTMENT_OTHER): Payer: Self-pay | Admitting: *Deleted

## 2019-06-16 ENCOUNTER — Other Ambulatory Visit: Payer: Self-pay

## 2019-06-16 ENCOUNTER — Emergency Department (HOSPITAL_BASED_OUTPATIENT_CLINIC_OR_DEPARTMENT_OTHER)
Admission: EM | Admit: 2019-06-16 | Discharge: 2019-06-16 | Disposition: A | Discharge status: Home / Self Care | Payer: No Typology Code available for payment source | Attending: Emergency Medicine | Admitting: Emergency Medicine

## 2019-06-16 ENCOUNTER — Emergency Department (HOSPITAL_BASED_OUTPATIENT_CLINIC_OR_DEPARTMENT_OTHER): Payer: No Typology Code available for payment source

## 2019-06-16 DIAGNOSIS — E119 Type 2 diabetes mellitus without complications: Secondary | ICD-10-CM | POA: Insufficient documentation

## 2019-06-16 DIAGNOSIS — Y939 Activity, unspecified: Secondary | ICD-10-CM | POA: Diagnosis not present

## 2019-06-16 DIAGNOSIS — Z7984 Long term (current) use of oral hypoglycemic drugs: Secondary | ICD-10-CM | POA: Diagnosis not present

## 2019-06-16 DIAGNOSIS — S161XXA Strain of muscle, fascia and tendon at neck level, initial encounter: Secondary | ICD-10-CM | POA: Diagnosis not present

## 2019-06-16 DIAGNOSIS — Z79899 Other long term (current) drug therapy: Secondary | ICD-10-CM | POA: Insufficient documentation

## 2019-06-16 DIAGNOSIS — Y999 Unspecified external cause status: Secondary | ICD-10-CM | POA: Diagnosis not present

## 2019-06-16 DIAGNOSIS — S199XXA Unspecified injury of neck, initial encounter: Secondary | ICD-10-CM | POA: Diagnosis present

## 2019-06-16 DIAGNOSIS — Y92410 Unspecified street and highway as the place of occurrence of the external cause: Secondary | ICD-10-CM | POA: Insufficient documentation
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
## 2019-11-01 ENCOUNTER — Emergency Department (HOSPITAL_BASED_OUTPATIENT_CLINIC_OR_DEPARTMENT_OTHER)
Admission: EM | Admit: 2019-11-01 | Discharge: 2019-11-01 | Disposition: A | Discharge status: Home / Self Care | Payer: Self-pay | Attending: Emergency Medicine | Admitting: Emergency Medicine

## 2019-11-01 ENCOUNTER — Other Ambulatory Visit: Payer: Self-pay

## 2019-11-01 ENCOUNTER — Encounter (HOSPITAL_BASED_OUTPATIENT_CLINIC_OR_DEPARTMENT_OTHER): Payer: Self-pay

## 2019-11-01 ENCOUNTER — Emergency Department (HOSPITAL_BASED_OUTPATIENT_CLINIC_OR_DEPARTMENT_OTHER): Payer: Self-pay

## 2019-11-01 DIAGNOSIS — E119 Type 2 diabetes mellitus without complications: Secondary | ICD-10-CM | POA: Insufficient documentation

## 2019-11-01 DIAGNOSIS — R0602 Shortness of breath: Secondary | ICD-10-CM | POA: Insufficient documentation

## 2019-11-01 DIAGNOSIS — Z20822 Contact with and (suspected) exposure to covid-19: Secondary | ICD-10-CM | POA: Insufficient documentation

## 2019-11-01 DIAGNOSIS — Z79899 Other long term (current) drug therapy: Secondary | ICD-10-CM | POA: Insufficient documentation

## 2019-11-01 DIAGNOSIS — R52 Pain, unspecified: Secondary | ICD-10-CM

## 2019-11-01 DIAGNOSIS — G44209 Tension-type headache, unspecified, not intractable: Secondary | ICD-10-CM | POA: Insufficient documentation

## 2019-11-01 DIAGNOSIS — R079 Chest pain, unspecified: Secondary | ICD-10-CM

## 2019-11-01 DIAGNOSIS — M7918 Myalgia, other site: Secondary | ICD-10-CM | POA: Insufficient documentation

## 2019-11-01 DIAGNOSIS — Z7984 Long term (current) use of oral hypoglycemic drugs: Secondary | ICD-10-CM | POA: Insufficient documentation

## 2019-11-01 DIAGNOSIS — R0789 Other chest pain: Secondary | ICD-10-CM | POA: Insufficient documentation

## 2019-11-01 DIAGNOSIS — R519 Headache, unspecified: Secondary | ICD-10-CM

## 2019-11-01 LAB — COMPREHENSIVE METABOLIC PANEL
ALT: 14 U/L (ref 0–44)
AST: 11 U/L — ABNORMAL LOW (ref 15–41)
Albumin: 3.2 g/dL — ABNORMAL LOW (ref 3.5–5.0)
Alkaline Phosphatase: 97 U/L (ref 38–126)
Anion gap: 13 (ref 5–15)
BUN: 13 mg/dL (ref 6–20)
CO2: 27 mmol/L (ref 22–32)
Calcium: 8.7 mg/dL — ABNORMAL LOW (ref 8.9–10.3)
Chloride: 90 mmol/L — ABNORMAL LOW (ref 98–111)
Creatinine, Ser: 0.65 mg/dL (ref 0.44–1.00)
GFR calc Af Amer: >60 mL/min (ref >60)
GFR calc non Af Amer: >60 mL/min (ref >60)
Glucose, Bld: 349 mg/dL — ABNORMAL HIGH (ref 70–99)
Potassium: 4.3 mmol/L (ref 3.5–5.1)
Sodium: 130 mmol/L — ABNORMAL LOW (ref 135–145)
Total Bilirubin: 0.5 mg/dL (ref 0.3–1.2)
Total Protein: 7.3 g/dL (ref 6.5–8.1)

## 2019-11-01 LAB — URINALYSIS, MICROSCOPIC (REFLEX)

## 2019-11-01 LAB — CBC WITH DIFFERENTIAL/PLATELET
Abs Immature Granulocytes: 0.02 10*3/uL (ref 0.00–0.07)
Basophils Absolute: 0 10*3/uL (ref 0.0–0.1)
Basophils Relative: 0 %
Eosinophils Absolute: 0 10*3/uL (ref 0.0–0.5)
Eosinophils Relative: 0 %
HCT: 34.6 % — ABNORMAL LOW (ref 36.0–46.0)
Hemoglobin: 12.4 g/dL (ref 12.0–15.0)
Immature Granulocytes: 0 %
Lymphocytes Relative: 31 %
Lymphs Abs: 1.9 10*3/uL (ref 0.7–4.0)
MCH: 31 pg (ref 26.0–34.0)
MCHC: 35.8 g/dL (ref 30.0–36.0)
MCV: 86.5 fL (ref 80.0–100.0)
Monocytes Absolute: 0.7 10*3/uL (ref 0.1–1.0)
Monocytes Relative: 11 %
Neutro Abs: 3.5 10*3/uL (ref 1.7–7.7)
Neutrophils Relative %: 58 %
Platelets: 313 10*3/uL (ref 150–400)
RBC: 4 MIL/uL (ref 3.87–5.11)
RDW: 10.6 % — ABNORMAL LOW (ref 11.5–15.5)
WBC: 6.1 10*3/uL (ref 4.0–10.5)
nRBC: 0 % (ref 0.0–0.2)

## 2019-11-01 LAB — URINALYSIS, ROUTINE W REFLEX MICROSCOPIC
Bilirubin Urine: NEGATIVE
Glucose, UA: >=500 mg/dL — AB
Ketones, ur: >80 mg/dL — AB
Leukocytes,Ua: NEGATIVE
Nitrite: NEGATIVE
Protein, ur: NEGATIVE mg/dL
Specific Gravity, Urine: 1.02 (ref 1.005–1.030)
pH: 5.5 (ref 5.0–8.0)

## 2019-11-01 LAB — LIPASE, BLOOD: Lipase: 21 U/L (ref 11–51)

## 2019-11-01 LAB — PREGNANCY, URINE: Preg Test, Ur: NEGATIVE

## 2019-11-01 LAB — SARS CORONAVIRUS 2 (TAT 6-24 HRS): SARS Coronavirus 2: NEGATIVE

## 2019-11-01 LAB — TROPONIN I (HIGH SENSITIVITY): Troponin I (High Sensitivity): 3 ng/L (ref <18)

## 2019-11-01 MED ORDER — ALBUTEROL SULFATE HFA 108 (90 BASE) MCG/ACT IN AERS: 1.0000 | INHALATION_SPRAY | Freq: Four times a day (QID) | RESPIRATORY_TRACT | 0 refills | Status: AC | PRN

## 2019-11-01 MED ORDER — ALBUTEROL SULFATE HFA 108 (90 BASE) MCG/ACT IN AERS
2.0000 | INHALATION_SPRAY | Freq: Four times a day (QID) | RESPIRATORY_TRACT | Status: DC | PRN
  Administered 2019-11-01: 2 via RESPIRATORY_TRACT
  Filled 2019-11-01: qty 6.7

## 2019-11-01 MED FILL — ALBUTEROL SULFATE HFA 108 (: 108 (90 BAS | 25 days supply | Qty: 9 | Fill #0

## 2019-11-01 NOTE — Discharge Instructions (Signed)
Work-up is very reassuring in the ER today.  Your glucose is elevated.  This can be followed up with the primary care doctor.  Your COVID-19 test is pending at this time.  If you develop any urinary symptoms make sure you discuss with your primary care doctor.  Motrin and Tylenol.  Pericardial fluids stay hydrated.  Use inhaler for any shortness of breath.

## 2019-11-01 NOTE — ED Provider Notes (Signed)
[facility] EMERGENCY DEPARTMENT Provider Note   CSN: [id number] Arrival date & time: 11/01/19  1155     History Chief Complaint  Patient presents with  . Shortness of Breath    Diana Snyder is a 24 y.o. female.  HPI 24 year old African-American female past medical history significant for diabetes presents the ER for multiple complaints.  Patient reports 2 weeks of loss of appetite, chills, headache, shortness of breath, sinus pressure, headache, muscle aches, chest pain.  Patient states that she is just not felt well over the past 2 weeks.  Patient reports that she noticed her shortness of breath was more 2 days ago.  Denies any exertional shortness of breath.  She reports that her left-sided chest pain is with palpation and movement of her left arm.  Patient reports generalized body aches.  Patient has no cardiac history.  No family cardiac history.  Patient reports having an IUD.  Does not report any history of tobacco use.  Patient reports a mild headache while at work today.  No known sick contacts.  She does report some nausea no vomiting.  Patient states she had COVID-19 in August 2020 and this feels very similar.  Patient did not receive a COVID-19 vaccine this year.  Patient has been taking BC powder for her symptoms.  She reports that she has not been checking her blood sugars at home.  She reports that she just got her insurance back and she needs a follow-up with her primary care doctor to schedule an appointment.  Patient does not take insulin at home.Chest pain is not exertional.  Denies any lower extremity swelling, history of PE/DVT, prolonged immobilization recent hospitalization/surgery, oral contraceptive use.    Past Medical History:  Diagnosis Date  . Diabetes mellitus without complication [facility])     Patient Active Problem List   Diagnosis Date Noted  . Gestational diabetes mellitus, class B1 09/25/2013  . Preexisting Class B diabetes complicating  pregnancy, antepartum 05/31/2013  . Pyelonephritis complicating pregnancy, antepartum 05/31/2013    Past Surgical History:  Procedure Laterality Date  . CESAREAN SECTION N/A 09/27/2013   Procedure: CESAREAN SECTION;  Surgeon: Lesly Dukes, MD;  Location: [facility];  Service: Obstetrics;  Laterality: N/A;     OB History    Gravida  1   Para  1   Term  1   Preterm      AB      Living  1     SAB      TAB      Ectopic      Multiple      Live Births  1           Family History  Problem Relation Age of Onset  . Diabetes Mother   . Hypertension Mother   . Hypertension Father   . Hypertension Maternal Grandfather     Social History   Tobacco Use  . Smoking status: Never Smoker  . Smokeless tobacco: Never Used  Substance Use Topics  . Alcohol use: Yes    Comment: occ  . Drug use: No    Home Medications Prior to Admission medications   Medication Sig Start Date End Date Taking? Authorizing Provider  glipiZIDE (GLUCOTROL XL) 2.5 MG 24 hr tablet Take 2.5 mg by mouth daily with breakfast.   Yes [provider]  levonorgestrel (MIRENA) 20 MCG/24HR IUD by Intrauterine route.   Yes [provider]  sitaGLIPtin-metformin (JANUMET) 50-1000 MG tablet Take 1  tablet by mouth 2 (two) times daily with a meal. 05/27/19  Yes Petrucelli, Samantha R, PA-C  albuterol (VENTOLIN HFA) 108 (90 Base) MCG/ACT inhaler Inhale 1-2 puffs into the lungs every 6 (six) hours as needed for wheezing or shortness of breath. 11/01/19   Theadore Blunck, Zack Seal, PA-C  azithromycin (ZITHROMAX) 250 MG tablet Take 1 tablet (250 mg total) by mouth daily. Take first 2 tablets together, then 1 every day until finished. 01/18/19   Fredia Sorrow, MD  budesonide (PULMICORT FLEXHALER) 180 MCG/ACT inhaler Inhale 1 puff into the lungs 2 (two) times daily for 7 days. 01/18/19 01/25/19  Fredia Sorrow, MD  cephALEXin (KEFLEX) 500 MG capsule Take 1 capsule (500 mg total) by mouth 2 (two) times  daily. 05/27/19   Petrucelli, Samantha R, PA-C  dicyclomine (BENTYL) 20 MG tablet Take 1 tablet (20 mg total) by mouth 2 (two) times daily. 12/11/17   Long, Wonda Olds, MD  metroNIDAZOLE (FLAGYL) 500 MG tablet Take 1 tablet (500 mg total) by mouth 2 (two) times daily. 03/15/19   Tedd Sias, PA  omeprazole (PRILOSEC) 20 MG capsule Take 1 capsule (20 mg total) by mouth daily. 05/27/19   Petrucelli, Samantha R, PA-C  ondansetron (ZOFRAN ODT) 4 MG disintegrating tablet Take 1 tablet (4 mg total) by mouth every 8 (eight) hours as needed for nausea or vomiting. 05/27/19   Petrucelli, Samantha R, PA-C  sucralfate (CARAFATE) 1 GM/10ML suspension Take 10 mLs (1 g total) by mouth 4 (four) times daily -  with meals and at bedtime. 05/27/19   Petrucelli, Glynda Jaeger, PA-C    Allergies    Patient has no known allergies.  Review of Systems   Review of Systems  Constitutional: Positive for chills. Negative for fever.  HENT: Negative for congestion, rhinorrhea and sore throat.   Eyes: Negative for discharge.  Respiratory: Positive for shortness of breath. Negative for cough.   Cardiovascular: Positive for chest pain. Negative for palpitations and leg swelling.  Gastrointestinal: Positive for nausea. Negative for abdominal pain, diarrhea and vomiting.  Genitourinary: Negative for difficulty urinating, dysuria, frequency, hematuria and urgency.  Musculoskeletal: Positive for arthralgias and myalgias.  Skin: Negative for color change.  Neurological: Positive for headaches.  Psychiatric/Behavioral: Negative for confusion.    Physical Exam Updated Vital Signs BP 125/82 (BP Location: Right Arm)   Pulse 98   Temp 98.6 F (37 C) (Oral)   Resp (!) 21   Ht 5\' 6"  (1.676 m)   Wt 104.3 kg   SpO2 99%   BMI 37.12 kg/m   Physical Exam Vitals and nursing note reviewed.  Constitutional:      General: She is not in acute distress.    Appearance: She is well-developed. She is not ill-appearing or  toxic-appearing.  HENT:     Head: Normocephalic and atraumatic.     Nose: Nose normal.     Mouth/Throat:     Mouth: Mucous membranes are moist.     Pharynx: Oropharynx is clear.  Eyes:     General:        Right eye: No discharge.        Left eye: No discharge.     Conjunctiva/sclera: Conjunctivae normal.     Pupils: Pupils are equal, round, and reactive to light.  Cardiovascular:     Rate and Rhythm: Normal rate and regular rhythm.     Heart sounds: Normal heart sounds. No murmur. No friction rub. No gallop.   Pulmonary:     Effort:  Pulmonary effort is normal. No respiratory distress.     Breath sounds: Normal breath sounds. No decreased breath sounds, wheezing, rhonchi or rales.  Chest:     Chest wall: No tenderness.  Abdominal:     General: Bowel sounds are normal.     Palpations: Abdomen is soft.     Tenderness: There is no abdominal tenderness. There is no guarding or rebound.  Musculoskeletal:        General: No tenderness. Normal range of motion.     Cervical back: Normal range of motion and neck supple.     Right lower leg: No tenderness. No edema.     Left lower leg: No tenderness. No edema.  Lymphadenopathy:     Cervical: No cervical adenopathy.  Skin:    General: Skin is warm and dry.     Capillary Refill: Capillary refill takes less than 2 seconds.  Neurological:     Mental Status: She is alert and oriented to person, place, and time.  Psychiatric:        Mood and Affect: Mood normal.        Behavior: Behavior normal.        Thought Content: Thought content normal.        Judgment: Judgment normal.     ED Results / Procedures / Treatments   Labs (all labs ordered are listed, but only abnormal results are displayed) Labs Reviewed  CBC WITH DIFFERENTIAL/PLATELET - Abnormal; Notable for the following components:      Result Value   HCT 34.6 (*)    RDW 10.6 (*)    All other components within normal limits  COMPREHENSIVE METABOLIC PANEL - Abnormal; Notable  for the following components:   Sodium 130 (*)    Chloride 90 (*)    Glucose, Bld 349 (*)    Calcium 8.7 (*)    Albumin 3.2 (*)    AST 11 (*)    All other components within normal limits  URINALYSIS, ROUTINE W REFLEX MICROSCOPIC - Abnormal; Notable for the following components:   Glucose, UA >=500 (*)    Hgb urine dipstick SMALL (*)    Ketones, ur >80 (*)    All other components within normal limits  URINALYSIS, MICROSCOPIC (REFLEX) - Abnormal; Notable for the following components:   Bacteria, UA MANY (*)    All other components within normal limits  SARS CORONAVIRUS 2 (TAT 6-24 HRS)  LIPASE, BLOOD  PREGNANCY, URINE  TROPONIN I (HIGH SENSITIVITY)    EKG EKG Interpretation  Date/Time:  Monday Nov 01 2019 13:19:46 EDT Ventricular Rate:  90 PR Interval:    QRS Duration: 92 QT Interval:  343 QTC Calculation: 420 R Axis:   39 Text Interpretation: Sinus rhythm Confirmed by Raeford Razor [phone]) on 11/01/2019 1:57:09 PM   Radiology DG Chest Portable 1 View  Result Date: 11/01/2019 CLINICAL DATA:  Chest pain, shortness of breath. EXAM: PORTABLE CHEST 1 VIEW COMPARISON:  January 18, 2019. FINDINGS: The heart size and mediastinal contours are within normal limits. Both lungs are clear. No pneumothorax or pleural effusion is noted. The visualized skeletal structures are unremarkable. IMPRESSION: No active disease. Electronically Signed   By: Lupita Raider M.D.   On: 11/01/2019 13:22    Procedures Procedures (including critical care time)  Medications Ordered in ED Medications  albuterol (VENTOLIN HFA) 108 (90 Base) MCG/ACT inhaler 2 puff (2 puffs Inhalation Given 11/01/19 1532)    ED Course  I have reviewed the triage vital signs  and the nursing notes.  Pertinent labs & imaging results that were available during my care of the patient were reviewed by me and considered in my medical decision making (see chart for details).    MDM Rules/Calculators/A&P                        24 year old presents the ER for multiple complaints.  Patient's vital signs are reassuring on exam.  Patient does not appear to be in any acute distress.  Heart regular rate and rhythm.  Lungs good auscultation bilaterally.  Does complain of some chest pain or shortness of breath.  Cardiac work-up was initiated.  Patient also complains of viral-like illness.  Will obtain COVID-19 swab.  Chest x-ray pending at this time.  Patient has no nuchal rigidity to suggest meningitis.  Doubt pneumonia.  No focal abdominal pain to suggest any intra-abdominal pathology.  Patient does have history of diabetes that does not seem to be well controlled given her prior glucose readings in the ER patient does not check her glucose at home.  Labs reviewed.  No leukocytosis.  Normal hemoglobin.  No significant electrolyte derangement.  Hyperglycemia 349 which appears to be close to patient's baseline.  Corrected sodium is normal.  Mild hypocalcemia of 8.7.  Lipase is normal.  UA with many bacteria and ketones but no other signs of infection.  Patient denies any urinary symptoms.  Will not treat at this time.  Patient troponin was negative.  EKG shows no ischemic changes.  Presentation seems very atypical for ACS, dissection, myocarditis or pericarditis.  Patient is PERC negative.  Low suspicion for PE.  COVID-19 test is pending at this time.  Again glucose is elevated but no signs of DKA.  Patient will need to continue checking her blood sugars at home.  Encouraged her to follow-up with her primary care doctor.  Have given her albuterol inhaler for her shortness of breath.  Discussed reasons she should return to the ER.  Pt is hemodynamically stable, in NAD, & able to ambulate in the ED. Evaluation does not show pathology that would require ongoing emergent intervention or inpatient treatment. I explained the diagnosis to the patient. Pain has been managed & has no complaints prior to dc. Pt is comfortable with above plan and is  stable for discharge at this time. All questions were answered prior to disposition. Strict return precautions for f/u to the ED were discussed. Encouraged follow up with PCP.  Final Clinical Impression(s) / ED Diagnoses Final diagnoses:  Shortness of breath  Chest pain, unspecified type  Generalized body aches  Nonintractable headache, unspecified chronicity pattern, unspecified headache type    Rx / DC Orders ED Discharge Orders         Ordered    albuterol (VENTOLIN HFA) 108 (90 Base) MCG/ACT inhaler  Every 6 hours PRN     11/01/19 1510           Wallace Keller 11/01/19 1812    Gwyneth Sprout, MD 11/02/19 2051

## 2019-11-01 NOTE — ED Triage Notes (Signed)
Pt states that she had Covid in August last year reports her symptoms today are similar. Pt states she has had SOB X2 weeks.

## 2020-06-17 ENCOUNTER — Emergency Department (HOSPITAL_BASED_OUTPATIENT_CLINIC_OR_DEPARTMENT_OTHER)
Admission: EM | Admit: 2020-06-17 | Discharge: 2020-06-17 | Disposition: A | Discharge status: Home / Self Care | Payer: BC Managed Care – PPO | Attending: Emergency Medicine | Admitting: Emergency Medicine

## 2020-06-17 ENCOUNTER — Encounter (HOSPITAL_BASED_OUTPATIENT_CLINIC_OR_DEPARTMENT_OTHER): Payer: Self-pay

## 2020-06-17 ENCOUNTER — Other Ambulatory Visit: Payer: Self-pay

## 2020-06-17 DIAGNOSIS — A6004 Herpesviral vulvovaginitis: Secondary | ICD-10-CM | POA: Insufficient documentation

## 2020-06-17 DIAGNOSIS — E1165 Type 2 diabetes mellitus with hyperglycemia: Secondary | ICD-10-CM | POA: Insufficient documentation

## 2020-06-17 DIAGNOSIS — R739 Hyperglycemia, unspecified: Secondary | ICD-10-CM | POA: Diagnosis present

## 2020-06-17 LAB — CBG MONITORING, ED: Glucose-Capillary: 264 mg/dL — ABNORMAL HIGH (ref 70–99)

## 2020-06-17 MED ORDER — JANUMET 50-1000 MG PO TABS
1.0000 | ORAL_TABLET | Freq: Two times a day (BID) | ORAL | 1 refills | Status: AC
Start: 2020-06-17 — End: ?

## 2020-06-17 MED ORDER — VALACYCLOVIR HCL 1 G PO TABS
1000.0000 mg | ORAL_TABLET | Freq: Every day | ORAL | 0 refills | Status: AC
Start: 2020-06-17 — End: 2020-06-22

## 2020-06-17 NOTE — ED Provider Notes (Signed)
[facility] Emergency Department Provider Note MRN:  [id number]  Arrival date & time: 06/17/20     Chief Complaint   Hyperglycemia   History of Present Illness   Diana Snyder is a 25 y.o. year-old female with a history of diabetes presenting to the ED with chief complaint of hyperglycemia.  Patient has been feeling like her blood sugars are high and so she has been taking her mother's insulin.  She has not been prescribed insulin for her diabetes in several years.  Has not been checking her sugars.  Has not been taking her Janumet.  She is here for evaluation of her blood sugar and she is also here for evaluation of a rash on her labia.  Present for a few days, painful and irritated.  Denies fever, no other complaints.  Review of Systems  A complete 10 system review of systems was obtained and all systems are negative except as noted in the HPI and PMH.   Patient's Health History    Past Medical History:  Diagnosis Date  . Diabetes mellitus without complication [facility])     Past Surgical History:  Procedure Laterality Date  . CESAREAN SECTION N/A 09/27/2013   Procedure: CESAREAN SECTION;  Surgeon: Lesly Dukes, MD;  Location: [facility] OR[facility];  Service: Obstetrics;  Laterality: N/A;    Family History  Problem Relation Age of Onset  . Diabetes Mother   . Hypertension Mother   . Hypertension Father   . Hypertension Maternal Grandfather     Social History   Socioeconomic History  . Marital status: Single    Spouse name: Not on file  . Number of children: Not on file  . Years of education: Not on file  . Highest education level: Not on file  Occupational History  . Not on file  Tobacco Use  . Smoking status: Never Smoker  . Smokeless tobacco: Never Used  Vaping Use  . Vaping Use: Never used  Substance and Sexual Activity  . Alcohol use: Yes    Comment: occ  . Drug use: No  . Sexual activity: Not on file  Other Topics Concern  . Not on file   Social History Narrative  . Not on file   Social Determinants of Health   Financial Resource Strain: Not on file  Food Insecurity: Not on file  Transportation Needs: Not on file  Physical Activity: Not on file  Stress: Not on file  Social Connections: Not on file  Intimate Partner Violence: Not on file     Physical Exam   Vitals:   06/17/20 0205  BP: (!) 162/107  Pulse: 84  Resp: 20  Temp: 98.4 F (36.9 C)  SpO2: 100%    CONSTITUTIONAL: Well-appearing, NAD NEURO:  Alert and oriented x 3, no focal deficits EYES:  eyes equal and reactive ENT/NECK:  no LAD, no JVD CARDIO: Regular rate, well-perfused, normal S1 and S2 PULM:  CTAB no wheezing or rhonchi GI/GU:  normal bowel sounds, non-distended, non-tender; vesicular appearing rash to the left labia minora MSK/SPINE:  No gross deformities, no edema SKIN:  no rash, atraumatic PSYCH:  Appropriate speech and behavior  *Additional and/or pertinent findings included in MDM below  Diagnostic and Interventional Summary    EKG Interpretation  Date/Time:    Ventricular Rate:    PR Interval:    QRS Duration:   QT Interval:    QTC Calculation:   R Axis:     Text Interpretation:  Labs Reviewed  CBG MONITORING, ED - Abnormal; Notable for the following components:      Result Value   Glucose-Capillary 264 (*)    All other components within normal limits    No orders to display    Medications - No data to display   Procedures  /  Critical Care Procedures  ED Course and Medical Decision Making  I have reviewed the triage vital signs, the nursing notes, and pertinent available records from the EMR.  Listed above are laboratory and imaging tests that I personally ordered, reviewed, and interpreted and then considered in my medical decision making (see below for details).  Mild hyperglycemia, patient not taking her Janumet, will refill.  Advised her to not take someone else's insulin as this can be very  dangerous.  Exam of the genitals is consistent with herpes, will provide course of valacyclovir.  No other emergent process, appropriate for discharge.       Elmer Sow. Pilar Plate, MD [facility] Emergency Medicine [facility] [email or web address]  Final Clinical Impressions(s) / ED Diagnoses     ICD-10-CM   1. Herpes simplex vulvovaginitis  A60.04   2. Hyperglycemia  R73.9     ED Discharge Orders         Ordered    valACYclovir (VALTREX) 1000 MG tablet  Daily        06/17/20 0256    sitaGLIPtin-metformin (JANUMET) 50-1000 MG tablet  2 times daily with meals        06/17/20 0256           Discharge Instructions Discussed with and Provided to Patient:     Discharge Instructions     You were evaluated in the Emergency Department and after careful evaluation, we did not find any emergent condition requiring admission or further testing in the hospital.  Your exam/testing today was overall reassuring.  Your genital rash seems consistent with herpes.  Please take the valacyclovir medication as directed.  Please return to the Emergency Department if you experience any worsening of your condition.  Thank you for allowing [place] to be a part of your care.       Sabas Sous, MD 06/17/20 [phone]

## 2020-06-17 NOTE — ED Triage Notes (Signed)
Pt presents with complaints of hyperglycemia. States she has been using her mother's insulin and hasn't been checking her sugars, but can "tell they are high". BGL 264 in triage. Also reports "blister" on vaginal area that has been draining for 2 days with pain.

## 2020-06-17 NOTE — Discharge Instructions (Signed)
You were evaluated in the Emergency Department and after careful evaluation, we did not find any emergent condition requiring admission or further testing in the hospital.  Your exam/testing today was overall reassuring.  Your genital rash seems consistent with herpes.  Please take the valacyclovir medication as directed.  Please return to the Emergency Department if you experience any worsening of your condition.  Thank you for allowing [place] to be a part of your care.

## 2020-11-07 ENCOUNTER — Encounter (HOSPITAL_BASED_OUTPATIENT_CLINIC_OR_DEPARTMENT_OTHER): Payer: Self-pay | Admitting: Emergency Medicine

## 2020-11-07 ENCOUNTER — Other Ambulatory Visit: Payer: Self-pay

## 2020-11-07 ENCOUNTER — Emergency Department (HOSPITAL_BASED_OUTPATIENT_CLINIC_OR_DEPARTMENT_OTHER)
Admission: EM | Admit: 2020-11-07 | Discharge: 2020-11-07 | Disposition: A | Discharge status: Home / Self Care | Payer: BC Managed Care – PPO | Attending: Emergency Medicine | Admitting: Emergency Medicine

## 2020-11-07 DIAGNOSIS — Z20822 Contact with and (suspected) exposure to covid-19: Secondary | ICD-10-CM | POA: Diagnosis not present

## 2020-11-07 DIAGNOSIS — R1084 Generalized abdominal pain: Secondary | ICD-10-CM | POA: Diagnosis not present

## 2020-11-07 DIAGNOSIS — R197 Diarrhea, unspecified: Secondary | ICD-10-CM | POA: Insufficient documentation

## 2020-11-07 DIAGNOSIS — R509 Fever, unspecified: Secondary | ICD-10-CM | POA: Diagnosis not present

## 2020-11-07 DIAGNOSIS — E119 Type 2 diabetes mellitus without complications: Secondary | ICD-10-CM | POA: Insufficient documentation

## 2020-11-07 DIAGNOSIS — R112 Nausea with vomiting, unspecified: Secondary | ICD-10-CM | POA: Insufficient documentation

## 2020-11-07 DIAGNOSIS — Z7984 Long term (current) use of oral hypoglycemic drugs: Secondary | ICD-10-CM | POA: Diagnosis not present

## 2020-11-07 DIAGNOSIS — R519 Headache, unspecified: Secondary | ICD-10-CM | POA: Diagnosis not present

## 2020-11-07 DIAGNOSIS — R5381 Other malaise: Secondary | ICD-10-CM | POA: Diagnosis not present

## 2020-11-07 DIAGNOSIS — R1010 Upper abdominal pain, unspecified: Secondary | ICD-10-CM | POA: Insufficient documentation

## 2020-11-07 LAB — CBC WITH DIFFERENTIAL/PLATELET
Abs Immature Granulocytes: 0.02 10*3/uL (ref 0.00–0.07)
Basophils Absolute: 0 10*3/uL (ref 0.0–0.1)
Basophils Relative: 0 %
Eosinophils Absolute: 0 10*3/uL (ref 0.0–0.5)
Eosinophils Relative: 0 %
HCT: 41.5 % (ref 36.0–46.0)
Hemoglobin: 15 g/dL (ref 12.0–15.0)
Immature Granulocytes: 0 %
Lymphocytes Relative: 8 %
Lymphs Abs: 0.7 10*3/uL (ref 0.7–4.0)
MCH: 32.3 pg (ref 26.0–34.0)
MCHC: 36.1 g/dL — ABNORMAL HIGH (ref 30.0–36.0)
MCV: 89.2 fL (ref 80.0–100.0)
Monocytes Absolute: 0.4 10*3/uL (ref 0.1–1.0)
Monocytes Relative: 5 %
Neutro Abs: 6.9 10*3/uL (ref 1.7–7.7)
Neutrophils Relative %: 87 %
Platelets: 262 10*3/uL (ref 150–400)
RBC: 4.65 MIL/uL (ref 3.87–5.11)
RDW: 10.9 % — ABNORMAL LOW (ref 11.5–15.5)
WBC: 8 10*3/uL (ref 4.0–10.5)
nRBC: 0 % (ref 0.0–0.2)

## 2020-11-07 LAB — URINALYSIS, ROUTINE W REFLEX MICROSCOPIC
Bilirubin Urine: NEGATIVE
Glucose, UA: >=500 mg/dL — AB
Ketones, ur: >80 mg/dL — AB
Leukocytes,Ua: NEGATIVE
Nitrite: POSITIVE — AB
Protein, ur: NEGATIVE mg/dL
Specific Gravity, Urine: <1.005 — ABNORMAL LOW (ref 1.005–1.030)
pH: 6 (ref 5.0–8.0)

## 2020-11-07 LAB — COMPREHENSIVE METABOLIC PANEL
ALT: 19 U/L (ref 0–44)
AST: 14 U/L — ABNORMAL LOW (ref 15–41)
Albumin: 4.1 g/dL (ref 3.5–5.0)
Alkaline Phosphatase: 68 U/L (ref 38–126)
Anion gap: 10 (ref 5–15)
BUN: 13 mg/dL (ref 6–20)
CO2: 24 mmol/L (ref 22–32)
Calcium: 9.1 mg/dL (ref 8.9–10.3)
Chloride: 97 mmol/L — ABNORMAL LOW (ref 98–111)
Creatinine, Ser: 0.6 mg/dL (ref 0.44–1.00)
GFR, Estimated: >60 mL/min (ref >60)
Glucose, Bld: 400 mg/dL — ABNORMAL HIGH (ref 70–99)
Potassium: 4.3 mmol/L (ref 3.5–5.1)
Sodium: 131 mmol/L — ABNORMAL LOW (ref 135–145)
Total Bilirubin: 0.8 mg/dL (ref 0.3–1.2)
Total Protein: 7.7 g/dL (ref 6.5–8.1)

## 2020-11-07 LAB — PREGNANCY, URINE: Preg Test, Ur: NEGATIVE

## 2020-11-07 LAB — URINALYSIS, MICROSCOPIC (REFLEX)

## 2020-11-07 LAB — LIPASE, BLOOD: Lipase: 26 U/L (ref 11–51)

## 2020-11-07 LAB — RESP PANEL BY RT-PCR (FLU A&B, COVID) ARPGX2
Influenza A by PCR: NEGATIVE
Influenza B by PCR: NEGATIVE
SARS Coronavirus 2 by RT PCR: NEGATIVE

## 2020-11-07 MED ORDER — ONDANSETRON HCL 4 MG/2ML IJ SOLN
4.0000 mg | Freq: Once | INTRAMUSCULAR | Status: AC
  Administered 2020-11-07: 4 mg via INTRAVENOUS
  Filled 2020-11-07: qty 2

## 2020-11-07 MED ORDER — ONDANSETRON 4 MG PO TBDP: ORAL_TABLET | ORAL | 0 refills | Status: AC

## 2020-11-07 MED ORDER — MORPHINE SULFATE (PF) 2 MG/ML IV SOLN
2.0000 mg | Freq: Once | INTRAVENOUS | Status: AC
  Administered 2020-11-07: 2 mg via INTRAVENOUS
  Filled 2020-11-07: qty 1

## 2020-11-07 MED ORDER — ALUM & MAG HYDROXIDE-SIMETH 200-200-20 MG/5ML PO SUSP
30.0000 mL | Freq: Once | ORAL | Status: AC
  Administered 2020-11-07: 30 mL via ORAL
  Filled 2020-11-07: qty 30

## 2020-11-07 MED ORDER — SODIUM CHLORIDE 0.9 % IV BOLUS
1000.0000 mL | Freq: Once | INTRAVENOUS | Status: AC
  Administered 2020-11-07: 1000 mL via INTRAVENOUS

## 2020-11-07 NOTE — Discharge Instructions (Signed)
You can take Imodium for diarrhea.  Please return for inability to eat or drink or worsening abdominal pain.

## 2020-11-07 NOTE — ED Provider Notes (Signed)
[facility] EMERGENCY DEPARTMENT Provider Note   CSN: [id number] Arrival date & time: 11/07/20  0126     History Chief Complaint  Patient presents with  . Abdominal Pain    Diana Snyder is a 25 y.o. female.  25 yo F with a chief complaints of nausea vomiting diarrhea subjective fevers and chills headache and malaise going on for 2 or 3 days now.  Started with some generalized and upper abdominal pain and then progressed into vomiting and diarrhea.  Denies bilious or bloody emesis denies dark or bloody stool.  No measured temperature at home.  Denies urinary symptoms.  Denies vaginal bleeding or discharge.  Denies recent travel or suspicious food intake.  The history is provided by the patient.  Abdominal Pain Pain location:  Generalized Pain quality: aching and cramping   Pain radiates to:  Does not radiate Pain severity:  Moderate Onset quality:  Gradual Duration:  2 days Timing:  Constant Progression:  Worsening Chronicity:  New Relieved by:  Nothing Worsened by:  Nothing Ineffective treatments:  None tried Associated symptoms: diarrhea, nausea and vomiting   Associated symptoms: no chest pain, no chills, no dysuria, no fever and no shortness of breath        Past Medical History:  Diagnosis Date  . Diabetes mellitus without complication [facility](HCC)     Patient Active Problem List   Diagnosis Date Noted  . Gestational diabetes mellitus, class B1 09/25/2013  . Preexisting Class B diabetes complicating pregnancy, antepartum 05/31/2013  . Pyelonephritis complicating pregnancy, antepartum 05/31/2013    Past Surgical History:  Procedure Laterality Date  . CESAREAN SECTION N/A 09/27/2013   Procedure: CESAREAN SECTION;  Surgeon: Lesly DukesKelly H Leggett, MD;  Location: [facility];  Service: Obstetrics;  Laterality: N/A;     OB History    Gravida  1   Para  1   Term  1   Preterm      AB      Living  1     SAB      IAB      Ectopic      Multiple       Live Births  1           Family History  Problem Relation Age of Onset  . Diabetes Mother   . Hypertension Mother   . Hypertension Father   . Hypertension Maternal Grandfather     Social History   Tobacco Use  . Smoking status: Never Smoker  . Smokeless tobacco: Never Used  Vaping Use  . Vaping Use: Never used  Substance Use Topics  . Alcohol use: Yes    Comment: occ  . Drug use: No    Home Medications Prior to Admission medications   Medication Sig Start Date End Date Taking? Authorizing Provider  ondansetron (ZOFRAN ODT) 4 MG disintegrating tablet 4mg  ODT q4 hours prn nausea/vomit 11/07/20  Yes Melene PlanFloyd, Shadee Rathod, DO  albuterol (VENTOLIN HFA) 108 (90 Base) MCG/ACT inhaler Inhale 1-2 puffs into the lungs every 6 (six) hours as needed for wheezing or shortness of breath. 11/01/19   Leaphart, Lynann BeaverKenneth T, PA-C  azithromycin (ZITHROMAX) 250 MG tablet Take 1 tablet (250 mg total) by mouth daily. Take first 2 tablets together, then 1 every day until finished. 01/18/19   Vanetta MuldersZackowski, Scott, MD  budesonide (PULMICORT FLEXHALER) 180 MCG/ACT inhaler Inhale 1 puff into the lungs 2 (two) times daily for 7 days. 01/18/19 01/25/19  Vanetta MuldersZackowski, Scott, MD  cephALEXin (KEFLEX) 500  MG capsule Take 1 capsule (500 mg total) by mouth 2 (two) times daily. 05/27/19   Petrucelli, Samantha R, PA-C  dicyclomine (BENTYL) 20 MG tablet Take 1 tablet (20 mg total) by mouth 2 (two) times daily. 12/11/17   Long, Arlyss Repress, MD  glipiZIDE (GLUCOTROL XL) 2.5 MG 24 hr tablet Take 2.5 mg by mouth daily with breakfast.    [provider]  levonorgestrel (MIRENA) 20 MCG/24HR IUD by Intrauterine route.    [provider]  metroNIDAZOLE (FLAGYL) 500 MG tablet Take 1 tablet (500 mg total) by mouth 2 (two) times daily. 03/15/19   Gailen Shelter, PA  omeprazole (PRILOSEC) 20 MG capsule Take 1 capsule (20 mg total) by mouth daily. 05/27/19   Petrucelli, Pleas Koch, PA-C  sitaGLIPtin-metformin (JANUMET) 50-1000 MG tablet  Take 1 tablet by mouth 2 (two) times daily with a meal. 06/17/20   Bero, Elmer Sow, MD  sucralfate (CARAFATE) 1 GM/10ML suspension Take 10 mLs (1 g total) by mouth 4 (four) times daily -  with meals and at bedtime. 05/27/19   Petrucelli, Samantha R, PA-C  valACYclovir (VALTREX) 500 MG tablet Take 500 mg by mouth daily. 08/21/20   [provider]    Allergies    Patient has no known allergies.  Review of Systems   Review of Systems  Constitutional: Negative for chills and fever.  HENT: Negative for congestion and rhinorrhea.   Eyes: Negative for redness and visual disturbance.  Respiratory: Negative for shortness of breath and wheezing.   Cardiovascular: Negative for chest pain and palpitations.  Gastrointestinal: Positive for abdominal pain, diarrhea, nausea and vomiting.  Genitourinary: Negative for dysuria and urgency.  Musculoskeletal: Negative for arthralgias and myalgias.  Skin: Negative for pallor and wound.  Neurological: Negative for dizziness and headaches.    Physical Exam Updated Vital Signs BP 124/82   Pulse 80   Temp 97.8 F (36.6 C) (Oral)   Resp 18   Ht 5\' 5"  (1.651 m)   Wt 104.3 kg   LMP 10/26/2020   SpO2 98%   BMI 38.26 kg/m   Physical Exam Vitals and nursing note reviewed.  Constitutional:      General: She is not in acute distress.    Appearance: She is well-developed. She is not diaphoretic.  HENT:     Head: Normocephalic and atraumatic.  Eyes:     Pupils: Pupils are equal, round, and reactive to light.  Cardiovascular:     Rate and Rhythm: Normal rate and regular rhythm.     Heart sounds: No murmur heard. No friction rub. No gallop.   Pulmonary:     Effort: Pulmonary effort is normal.     Breath sounds: No wheezing or rales.  Abdominal:     General: There is no distension.     Palpations: Abdomen is soft.     Tenderness: There is abdominal tenderness.     Comments: Mild diffuse abdominal tenderness without focality  Musculoskeletal:         General: No tenderness.     Cervical back: Normal range of motion and neck supple.  Skin:    General: Skin is warm and dry.  Neurological:     Mental Status: She is alert and oriented to person, place, and time.  Psychiatric:        Behavior: Behavior normal.     ED Results / Procedures / Treatments   Labs (all labs ordered are listed, but only abnormal results are displayed) Labs Reviewed  CBC WITH DIFFERENTIAL/PLATELET - Abnormal; Notable for the following components:      Result Value   MCHC 36.1 (*)    RDW 10.9 (*)    All other components within normal limits  COMPREHENSIVE METABOLIC PANEL - Abnormal; Notable for the following components:   Sodium 131 (*)    Chloride 97 (*)    Glucose, Bld 400 (*)    AST 14 (*)    All other components within normal limits  URINALYSIS, ROUTINE W REFLEX MICROSCOPIC - Abnormal; Notable for the following components:   Specific Gravity, Urine <1.005 (*)    Glucose, UA >=500 (*)    Hgb urine dipstick TRACE (*)    Ketones, ur >80 (*)    Nitrite POSITIVE (*)    All other components within normal limits  URINALYSIS, MICROSCOPIC (REFLEX) - Abnormal; Notable for the following components:   Bacteria, UA MANY (*)    All other components within normal limits  RESP PANEL BY RT-PCR (FLU A&B, COVID) ARPGX2  LIPASE, BLOOD  PREGNANCY, URINE    EKG None  Radiology No results found.  Procedures Procedures   Medications Ordered in ED Medications  sodium chloride 0.9 % bolus 1,000 mL ( Intravenous Stopped 11/07/20 0426)  ondansetron (ZOFRAN) injection 4 mg (4 mg Intravenous Given 11/07/20 0325)  morphine 2 MG/ML injection 2 mg (2 mg Intravenous Given 11/07/20 0328)  alum & mag hydroxide-simeth (MAALOX/MYLANTA) 200-200-20 MG/5ML suspension 30 mL (30 mLs Oral Given 11/07/20 0416)    ED Course  I have reviewed the triage vital signs and the nursing notes.  Pertinent labs & imaging results that were available during my care of the patient  were reviewed by me and considered in my medical decision making (see chart for details).    MDM Rules/Calculators/A&P                          25 yo F with a chief complaints of nausea vomiting and diarrhea going on for a couple days.  No focal abdominal findings.  Will obtain laboratory evaluation treat symptoms reassess.  Labs without significant finding.  The patient does have nitrate positive urine.  I discussed this with her and she again denies any urinary symptoms.  No flank pain.  We will hold off on antibiotic therapy.  PCP follow-up.  11:26 PM:  I have discussed the diagnosis/risks/treatment options with the patient and believe the pt to be eligible for discharge home to follow-up with PCP. We also discussed returning to the ED immediately if new or worsening sx occur. We discussed the sx which are most concerning (e.g., sudden worsening pain, fever, inability to tolerate by mouth) that necessitate immediate return. Medications administered to the patient during their visit and any new prescriptions provided to the patient are listed below.  Medications given during this visit Medications  sodium chloride 0.9 % bolus 1,000 mL ( Intravenous Stopped 11/07/20 0426)  ondansetron (ZOFRAN) injection 4 mg (4 mg Intravenous Given 11/07/20 0325)  morphine 2 MG/ML injection 2 mg (2 mg Intravenous Given 11/07/20 0328)  alum & mag hydroxide-simeth (MAALOX/MYLANTA) 200-200-20 MG/5ML suspension 30 mL (30 mLs Oral Given 11/07/20 0416)     The patient appears reasonably screen and/or stabilized for discharge and I doubt any other medical condition or other [facility] requiring further screening, evaluation, or treatment in the ED at this time prior to discharge.   Final Clinical Impression(s) / ED Diagnoses Final diagnoses:  Nausea vomiting and  diarrhea    Rx / DC Orders ED Discharge Orders         Ordered    ondansetron (ZOFRAN ODT) 4 MG disintegrating tablet        11/07/20 0528            Melene Plan, DO 11/07/20 2326

## 2020-11-07 NOTE — ED Triage Notes (Signed)
Patient presents with complaints of abd pain and NVD, headache, chills, and sweats, states onset yesterday.

## 2021-07-30 ENCOUNTER — Emergency Department (HOSPITAL_BASED_OUTPATIENT_CLINIC_OR_DEPARTMENT_OTHER)
Admission: EM | Admit: 2021-07-30 | Discharge: 2021-07-30 | Disposition: A | Discharge status: Home / Self Care | Payer: BC Managed Care – PPO | Attending: Emergency Medicine | Admitting: Emergency Medicine

## 2021-07-30 ENCOUNTER — Encounter (HOSPITAL_BASED_OUTPATIENT_CLINIC_OR_DEPARTMENT_OTHER): Payer: Self-pay

## 2021-07-30 ENCOUNTER — Other Ambulatory Visit: Payer: Self-pay

## 2021-07-30 DIAGNOSIS — R519 Headache, unspecified: Secondary | ICD-10-CM

## 2021-07-30 MED ORDER — IBUPROFEN 800 MG PO TABS
800.0000 mg | ORAL_TABLET | Freq: Once | ORAL | Status: AC
  Administered 2021-07-30: 800 mg via ORAL
  Filled 2021-07-30: qty 1

## 2021-07-30 MED ORDER — ACETAMINOPHEN 500 MG PO TABS
1000.0000 mg | ORAL_TABLET | Freq: Once | ORAL | Status: AC
  Administered 2021-07-30: 1000 mg via ORAL
  Filled 2021-07-30: qty 2

## 2021-07-30 NOTE — ED Provider Notes (Signed)
[facility] EMERGENCY DEPARTMENT Provider Note   CSN: [id number] Arrival date & time: 07/30/21  0321     History  Chief Complaint  Patient presents with   Headache    Diana Snyder is a 26 y.o. female.  The history is provided by the patient.  Illness Location:  Crown of the head Quality:  Scalp itself hurts after attempting to put hair in a pony tail Severity:  Mild Onset quality:  Sudden Duration: hours. Timing:  Constant Progression:  Unchanged Chronicity:  New Context:  Putting hair in a pony tail Relieved by:  Nothing Worsened by:  Pulling hair Ineffective treatments:  None Associated symptoms: no cough, no fever, no loss of consciousness, no rash, no sore throat, no vomiting and no wheezing   Risk factors:  None     Home Medications Prior to Admission medications   Medication Sig Start Date End Date Taking? Authorizing Provider  albuterol (VENTOLIN HFA) 108 (90 Base) MCG/ACT inhaler Inhale 1-2 puffs into the lungs every 6 (six) hours as needed for wheezing or shortness of breath. 11/01/19   Leaphart, Lynann Beaver, PA-C  azithromycin (ZITHROMAX) 250 MG tablet Take 1 tablet (250 mg total) by mouth daily. Take first 2 tablets together, then 1 every day until finished. 01/18/19   Vanetta Mulders, MD  budesonide (PULMICORT FLEXHALER) 180 MCG/ACT inhaler Inhale 1 puff into the lungs 2 (two) times daily for 7 days. 01/18/19 01/25/19  Vanetta Mulders, MD  cephALEXin (KEFLEX) 500 MG capsule Take 1 capsule (500 mg total) by mouth 2 (two) times daily. 05/27/19   Petrucelli, Samantha R, PA-C  dicyclomine (BENTYL) 20 MG tablet Take 1 tablet (20 mg total) by mouth 2 (two) times daily. 12/11/17   Long, Arlyss Repress, MD  glipiZIDE (GLUCOTROL XL) 2.5 MG 24 hr tablet Take 2.5 mg by mouth daily with breakfast.    [provider]  levonorgestrel (MIRENA) 20 MCG/24HR IUD by Intrauterine route.    [provider]  metroNIDAZOLE (FLAGYL) 500 MG tablet Take 1 tablet  (500 mg total) by mouth 2 (two) times daily. 03/15/19   Gailen Shelter, PA  omeprazole (PRILOSEC) 20 MG capsule Take 1 capsule (20 mg total) by mouth daily. 05/27/19   Petrucelli, Samantha R, PA-C  ondansetron (ZOFRAN ODT) 4 MG disintegrating tablet 4mg  ODT q4 hours prn nausea/vomit 11/07/20   Melene Plan, DO  sitaGLIPtin-metformin (JANUMET) 50-1000 MG tablet Take 1 tablet by mouth 2 (two) times daily with a meal. 06/17/20   Bero, Elmer Sow, MD  sucralfate (CARAFATE) 1 GM/10ML suspension Take 10 mLs (1 g total) by mouth 4 (four) times daily -  with meals and at bedtime. 05/27/19   Petrucelli, Samantha R, PA-C  valACYclovir (VALTREX) 500 MG tablet Take 500 mg by mouth daily. 08/21/20   [provider]      Allergies    Patient has no known allergies.    Review of Systems   Review of Systems  Constitutional:  Negative for fever.  HENT:  Negative for sore throat.   Eyes:  Negative for redness.  Respiratory:  Negative for cough and wheezing.   Gastrointestinal:  Negative for vomiting.  Genitourinary:  Negative for difficulty urinating.  Musculoskeletal:  Negative for neck pain.  Skin:  Negative for rash.  Neurological:  Negative for dizziness and loss of consciousness.  Psychiatric/Behavioral:  Negative for agitation.   All other systems reviewed and are negative.  Physical Exam Updated Vital Signs BP 126/90 (BP Location: Right Arm)  Pulse 86    Temp 98.6 F (37 C) (Oral)    Resp 16    Ht 5\' 6"  (1.676 m)    Wt 99.8 kg    SpO2 98%    BMI 35.51 kg/m  Physical Exam Vitals and nursing note reviewed.  Constitutional:      General: She is not in acute distress.    Appearance: She is well-developed.  HENT:     Head: Normocephalic and atraumatic.     Comments: Scalp is normal on exam     Nose: Nose normal.  Eyes:     Extraocular Movements: Extraocular movements intact.     Pupils: Pupils are equal, round, and reactive to light.  Cardiovascular:     Rate and Rhythm: Normal rate  and regular rhythm.     Pulses: Normal pulses.     Heart sounds: Normal heart sounds.  Pulmonary:     Effort: Pulmonary effort is normal.     Breath sounds: Normal breath sounds.  Abdominal:     General: Abdomen is flat. Bowel sounds are normal.     Palpations: Abdomen is soft.     Tenderness: There is no abdominal tenderness.  Musculoskeletal:        General: Normal range of motion.     Cervical back: Normal range of motion and neck supple.  Skin:    General: Skin is warm and dry.     Capillary Refill: Capillary refill takes less than 2 seconds.  Neurological:     General: No focal deficit present.     Mental Status: She is alert and oriented to person, place, and time.     Deep Tendon Reflexes: Reflexes normal.  Psychiatric:        Mood and Affect: Mood normal.        Behavior: Behavior normal.    ED Results / Procedures / Treatments   Labs (all labs ordered are listed, but only abnormal results are displayed) Labs Reviewed - No data to display  EKG None  Radiology No results found.  Procedures Procedures    Medications Ordered in ED Medications  ibuprofen (ADVIL) tablet 800 mg (has no administration in time range)  acetaminophen (TYLENOL) tablet 1,000 mg (has no administration in time range)    ED Course/ Medical Decision Making/ A&P                           Medical Decision Making Scalp pain putting in a pony tail   Amount and/or Complexity of Data Reviewed External Data Reviewed: notes.    Details: multiple ED visits reviewed  Risk OTC drugs. Prescription drug management. Risk Details: Pin in the scalp, ice applied and medication given.  Patient informed to not use a pony tail for the next few days     Final Clinical Impression(s) / ED Diagnoses Final diagnoses:  Scalp pain   None    Return for intractable cough, coughing up blood, fevers > 100.4 unrelieved by medication, shortness of breath, intractable vomiting, chest pain, shortness of  breath, weakness, numbness, changes in speech, facial asymmetry, abdominal pain, passing out, Inability to tolerate liquids or food, cough, altered mental status or any concerns. No signs of systemic illness or infection. The patient is nontoxic-appearing on exam and vital signs are within normal limits.  I have reviewed the triage vital signs and the nursing notes. Pertinent labs & imaging results that were available during my care of the patient  were reviewed by me and considered in my medical decision making (see chart for details). After history, exam, and medical workup I feel the patient has been appropriately medically screened and is safe for discharge home. Pertinent diagnoses were discussed with the patient. Patient was given return precautions. Rx / DC Orders ED Discharge Orders     None         Alysse Rathe, MD 07/30/21 9528

## 2021-07-30 NOTE — ED Triage Notes (Signed)
Pt c/o pain on right side of scalp that she noticed last night. Pt has had nausea, denies injury.
